# Patient Record
Sex: Female | Born: 1960 | Race: White | Hispanic: No | Marital: Married | Smoking: Never smoker
Health system: Southern US, Community
[De-identification: ages and names within clinical notes are randomized; demographics above are authoritative.]

## PROBLEM LIST (undated history)

## (undated) DIAGNOSIS — I10 Essential (primary) hypertension: Secondary | ICD-10-CM

## (undated) DIAGNOSIS — F419 Anxiety disorder, unspecified: Secondary | ICD-10-CM

## (undated) DIAGNOSIS — F329 Major depressive disorder, single episode, unspecified: Secondary | ICD-10-CM

## (undated) DIAGNOSIS — F32A Depression, unspecified: Secondary | ICD-10-CM

## (undated) HISTORY — DX: Major depressive disorder, single episode, unspecified: F32.9

## (undated) HISTORY — DX: Essential (primary) hypertension: I10

## (undated) HISTORY — PX: SPINE SURGERY: SHX786

## (undated) HISTORY — DX: Anxiety disorder, unspecified: F41.9

## (undated) HISTORY — DX: Depression, unspecified: F32.A

---

## 2001-09-24 HISTORY — PX: BREAST SURGERY: SHX581

## 2001-12-10 ENCOUNTER — Ambulatory Visit (HOSPITAL_BASED_OUTPATIENT_CLINIC_OR_DEPARTMENT_OTHER): Admission: RE | Admit: 2001-12-10 | Discharge: 2001-12-10 | Payer: Self-pay | Admitting: General Surgery

## 2003-09-25 HISTORY — PX: BREAST EXCISIONAL BIOPSY: SUR124

## 2013-08-14 ENCOUNTER — Ambulatory Visit: Payer: Self-pay | Admitting: Unknown Physician Specialty

## 2013-08-18 LAB — PATHOLOGY REPORT

## 2013-08-25 ENCOUNTER — Encounter (HOSPITAL_COMMUNITY): Payer: Self-pay | Admitting: Pharmacy Technician

## 2015-07-03 ENCOUNTER — Other Ambulatory Visit: Payer: Self-pay | Admitting: Family Medicine

## 2015-07-05 DIAGNOSIS — F329 Major depressive disorder, single episode, unspecified: Secondary | ICD-10-CM | POA: Insufficient documentation

## 2015-07-05 DIAGNOSIS — F32A Depression, unspecified: Secondary | ICD-10-CM | POA: Insufficient documentation

## 2015-07-05 DIAGNOSIS — I10 Essential (primary) hypertension: Secondary | ICD-10-CM | POA: Insufficient documentation

## 2015-07-06 ENCOUNTER — Encounter: Payer: Self-pay | Admitting: Family Medicine

## 2015-07-06 ENCOUNTER — Ambulatory Visit (INDEPENDENT_AMBULATORY_CARE_PROVIDER_SITE_OTHER): Payer: 59 | Admitting: Family Medicine

## 2015-07-06 VITALS — BP 125/62 | HR 82 | Temp 98.6°F | Ht 67.2 in | Wt 121.0 lb

## 2015-07-06 DIAGNOSIS — Z23 Encounter for immunization: Secondary | ICD-10-CM | POA: Diagnosis not present

## 2015-07-06 DIAGNOSIS — I10 Essential (primary) hypertension: Secondary | ICD-10-CM | POA: Diagnosis not present

## 2015-07-06 DIAGNOSIS — F329 Major depressive disorder, single episode, unspecified: Secondary | ICD-10-CM

## 2015-07-06 DIAGNOSIS — F32A Depression, unspecified: Secondary | ICD-10-CM

## 2015-07-06 MED ORDER — LOSARTAN POTASSIUM 50 MG PO TABS: 50.0000 mg | ORAL_TABLET | Freq: Every day | ORAL | Status: DC

## 2015-07-06 NOTE — Assessment & Plan Note (Signed)
For depression will try reducing Zoloft to 25 mg through the winter and then consider stopping medication spring.  Will get a family member or friend to monitor for recurrence.

## 2015-07-06 NOTE — Assessment & Plan Note (Signed)
For blood pressure will try reducing hydrochlorothiazide in half for several weeks monitoring blood pressure then stopping for several weeks monitoring blood pressure  If still needing blood pressure medication will prescribe losartan. If goes on losartan will need recheck after on medication for one month and check BMP.

## 2015-07-06 NOTE — Progress Notes (Signed)
BP 125/62 mmHg  Pulse 82  Temp(Src) 98.6 F (37 C)  Ht 5' 7.2" (1.707 m)  Wt 121 lb (54.885 kg)  BMI 18.84 kg/m2  SpO2 99%   Subjective:    Patient ID: Sierra Curtis, female    DOB: Feb 17, 1961, 54 y.o.   MRN: [id number]  HPI: Sierra Curtis is a 54 y.o. female  Chief Complaint  Patient presents with  . Hypertension   patient recheck hypertension concerned hydrochlorothiazide may be causing some hair loss though may be having female pattern hair loss that her mother might have had. Also has lost weight exercising and cleaned up her diet wondering if she still needs medication area Also taking Zoloft interested in maybe getting off but in no rush No symptoms of depression for years.  Relevant past medical, surgical, family and social history reviewed and updated as indicated. Interim medical history since our last visit reviewed. Allergies and medications reviewed and updated.  Review of Systems  Constitutional: Negative.   Respiratory: Negative.   Cardiovascular: Negative.     Per HPI unless specifically indicated above     Objective:    BP 125/62 mmHg  Pulse 82  Temp(Src) 98.6 F (37 C)  Ht 5' 7.2" (1.707 m)  Wt 121 lb (54.885 kg)  BMI 18.84 kg/m2  SpO2 99%  Wt Readings from Last 3 Encounters:  07/06/15 121 lb (54.885 kg)  01/03/15 132 lb (59.875 kg)    Physical Exam  Constitutional: She is oriented to person, place, and time. She appears well-developed and well-nourished. No distress.  HENT:  Head: Normocephalic and atraumatic.  Right Ear: Hearing normal.  Left Ear: Hearing normal.  Nose: Nose normal.  Eyes: Conjunctivae and lids are normal. Right eye exhibits no discharge. Left eye exhibits no discharge. No scleral icterus.  Pulmonary/Chest: Effort normal. No respiratory distress.  Musculoskeletal: Normal range of motion.  Neurological: She is alert and oriented to person, place, and time.  Skin: Skin is intact. No rash noted.  Psychiatric: She has a  normal mood and affect. Her speech is normal and behavior is normal. Judgment and thought content normal. Cognition and memory are normal.        Assessment & Plan:   Problem List Items Addressed This Visit      Cardiovascular and Mediastinum   Hypertension    For blood pressure will try reducing hydrochlorothiazide in half for several weeks monitoring blood pressure then stopping for several weeks monitoring blood pressure  If still needing blood pressure medication will prescribe losartan. If goes on losartan will need recheck after on medication for one month and check BMP.      Relevant Medications   losartan (COZAAR) 50 MG tablet     Other   Depression    For depression will try reducing Zoloft to 25 mg through the winter and then consider stopping medication spring.  Will get a family member or friend to monitor for recurrence.       Other Visit Diagnoses    Essential hypertension, benign    -  Primary    Relevant Medications    losartan (COZAAR) 50 MG tablet    Other Relevant Orders    Basic metabolic panel    Magnesium    Immunization due        Relevant Orders    Flu Vaccine QUAD 36+ mos PF IM (Fluarix & Fluzone Quad PF) (Completed)        Follow up plan: Return in  about 6 months (around 01/04/2016) for Physical Exam.

## 2015-07-07 ENCOUNTER — Encounter: Payer: Self-pay | Admitting: Family Medicine

## 2015-07-07 LAB — BASIC METABOLIC PANEL
BUN/Creatinine Ratio: 13 (ref 9–23)
BUN: 12 mg/dL (ref 6–24)
CALCIUM: 9.7 mg/dL (ref 8.7–10.2)
CO2: 27 mmol/L (ref 18–29)
CREATININE: 0.95 mg/dL (ref 0.57–1.00)
Chloride: 101 mmol/L (ref 97–108)
GFR calc Af Amer: 79 mL/min/{1.73_m2} (ref >59)
GFR, EST NON AFRICAN AMERICAN: 69 mL/min/{1.73_m2} (ref >59)
Glucose: 84 mg/dL (ref 65–99)
Potassium: 3.8 mmol/L (ref 3.5–5.2)
Sodium: 143 mmol/L (ref 134–144)

## 2015-07-07 LAB — MAGNESIUM: Magnesium: 2 mg/dL (ref 1.6–2.3)

## 2016-01-04 ENCOUNTER — Ambulatory Visit (INDEPENDENT_AMBULATORY_CARE_PROVIDER_SITE_OTHER): Payer: 59 | Admitting: Family Medicine

## 2016-01-04 ENCOUNTER — Encounter: Payer: Self-pay | Admitting: Family Medicine

## 2016-01-04 VITALS — BP 104/64 | HR 67 | Temp 98.0°F | Ht 67.7 in | Wt 123.0 lb

## 2016-01-04 DIAGNOSIS — Z0000 Encounter for general adult medical examination without abnormal findings: Secondary | ICD-10-CM

## 2016-01-04 DIAGNOSIS — F329 Major depressive disorder, single episode, unspecified: Secondary | ICD-10-CM

## 2016-01-04 DIAGNOSIS — F32A Depression, unspecified: Secondary | ICD-10-CM

## 2016-01-04 DIAGNOSIS — I10 Essential (primary) hypertension: Secondary | ICD-10-CM | POA: Diagnosis not present

## 2016-01-04 LAB — URINALYSIS, ROUTINE W REFLEX MICROSCOPIC
BILIRUBIN UA: NEGATIVE
GLUCOSE, UA: NEGATIVE
Ketones, UA: NEGATIVE
Leukocytes, UA: NEGATIVE
Nitrite, UA: NEGATIVE
PROTEIN UA: NEGATIVE
RBC UA: NEGATIVE
SPEC GRAV UA: 1.015 (ref 1.005–1.030)
UUROB: 0.2 mg/dL (ref 0.2–1.0)
pH, UA: 7 (ref 5.0–7.5)

## 2016-01-04 MED ORDER — SERTRALINE HCL 50 MG PO TABS: ORAL_TABLET | ORAL | Status: DC

## 2016-01-04 NOTE — Progress Notes (Signed)
BP 104/64 mmHg  Pulse 67  Temp(Src) 98 F (36.7 C)  Ht 5' 7.7" (1.72 m)  Wt 123 lb (55.792 kg)  BMI 18.86 kg/m2  SpO2 99%   Subjective:    Patient ID: Sierra Curtis, female    DOB: 1961/08/29, 56 y.o.   MRN: [id number]  HPI: Sierra Curtis is a 55 y.o. female  Chief Complaint  Patient presents with  . Annual Exam   Patient continues to do well has been able to maintain weight loss and good blood pressure control without taking any medication. Hair loss hasn't noticed as much and is not a problem now. Doing well with Zoloft and has decided to stay on medicine for now as doesn't want to run the risk of feeling bad again. Has had mammogram and Pap smear which are reported as normal.  Relevant past medical, surgical, family and social history reviewed and updated as indicated. Interim medical history since our last visit reviewed. Allergies and medications reviewed and updated.  Review of Systems  Constitutional: Negative.   HENT: Negative.   Eyes: Negative.   Respiratory: Negative.   Cardiovascular: Negative.   Gastrointestinal: Negative.   Endocrine: Negative.   Genitourinary: Negative.   Musculoskeletal: Negative.   Skin: Negative.   Allergic/Immunologic: Negative.   Neurological: Negative.   Hematological: Negative.   Psychiatric/Behavioral: Negative.     Per HPI unless specifically indicated above     Objective:    BP 104/64 mmHg  Pulse 67  Temp(Src) 98 F (36.7 C)  Ht 5' 7.7" (1.72 m)  Wt 123 lb (55.792 kg)  BMI 18.86 kg/m2  SpO2 99%  Wt Readings from Last 3 Encounters:  01/04/16 123 lb (55.792 kg)  07/06/15 121 lb (54.885 kg)  01/03/15 132 lb (59.875 kg)    Physical Exam  Constitutional: She is oriented to person, place, and time. She appears well-developed and well-nourished.  HENT:  Head: Normocephalic and atraumatic.  Right Ear: External ear normal.  Left Ear: External ear normal.  Nose: Nose normal.  Mouth/Throat: Oropharynx is clear and  moist.  Eyes: Conjunctivae and EOM are normal. Pupils are equal, round, and reactive to light.  Neck: Normal range of motion. Neck supple. Carotid bruit is not present.  Cardiovascular: Normal rate, regular rhythm and normal heart sounds.   No murmur heard. Pulmonary/Chest: Effort normal and breath sounds normal.  Abdominal: Soft. Bowel sounds are normal. There is no hepatosplenomegaly.  Musculoskeletal: Normal range of motion.  Neurological: She is alert and oriented to person, place, and time.  Skin: No rash noted.  Psychiatric: She has a normal mood and affect. Her behavior is normal. Judgment and thought content normal.    Results for orders placed or performed in visit on [id number]  Basic metabolic panel  Result Value Ref Range   Glucose 84 65 - 99 mg/dL   BUN 12 6 - 24 mg/dL   Creatinine, Ser 0.95 0.57 - 1.00 mg/dL   GFR calc non Af Amer 69 >59 mL/min/1.73   GFR calc Af Amer 79 >59 mL/min/1.73   BUN/Creatinine Ratio 13 9 - 23   Sodium 143 134 - 144 mmol/L   Potassium 3.8 3.5 - 5.2 mmol/L   Chloride 101 97 - 108 mmol/L   CO2 27 18 - 29 mmol/L   Calcium 9.7 8.7 - 10.2 mg/dL  Magnesium  Result Value Ref Range   Magnesium 2.0 1.6 - 2.3 mg/dL      Assessment & Plan:   Problem  List Items Addressed This Visit      Cardiovascular and Mediastinum   Hypertension    Diet controled no meds      Relevant Orders   Comprehensive metabolic panel   Lipid panel   CBC with Differential/Platelet   TSH   Urinalysis, Routine w reflex microscopic (not at [facility])     Other   Depression    The current medical regimen is effective;  continue present plan and medications.       Relevant Medications   sertraline (ZOLOFT) 50 MG tablet   Other Relevant Orders   Comprehensive metabolic panel   Lipid panel   CBC with Differential/Platelet   TSH   Urinalysis, Routine w reflex microscopic (not at [facility])    Other Visit Diagnoses    PE (physical exam), annual    -  Primary    Relevant  Orders    Comprehensive metabolic panel    Lipid panel    CBC with Differential/Platelet    TSH    Urinalysis, Routine w reflex microscopic (not at [facility])        Follow up plan: Return in about 6 months (around 07/05/2016) for recheck zoloft.

## 2016-01-04 NOTE — Assessment & Plan Note (Signed)
The current medical regimen is effective;  continue present plan and medications.  

## 2016-01-04 NOTE — Assessment & Plan Note (Signed)
Diet controled no meds

## 2016-01-05 ENCOUNTER — Encounter: Payer: Self-pay | Admitting: Family Medicine

## 2016-01-05 LAB — CBC WITH DIFFERENTIAL/PLATELET
BASOS ABS: 0 10*3/uL (ref 0.0–0.2)
BASOS: 0 %
EOS (ABSOLUTE): 0.1 10*3/uL (ref 0.0–0.4)
Eos: 2 %
HEMOGLOBIN: 12.7 g/dL (ref 11.1–15.9)
Hematocrit: 38.4 % (ref 34.0–46.6)
IMMATURE GRANS (ABS): 0 10*3/uL (ref 0.0–0.1)
Immature Granulocytes: 0 %
LYMPHS ABS: 1.6 10*3/uL (ref 0.7–3.1)
LYMPHS: 29 %
MCH: 31.3 pg (ref 26.6–33.0)
MCHC: 33.1 g/dL (ref 31.5–35.7)
MCV: 95 fL (ref 79–97)
MONOCYTES: 7 %
Monocytes Absolute: 0.4 10*3/uL (ref 0.1–0.9)
NEUTROS ABS: 3.4 10*3/uL (ref 1.4–7.0)
Neutrophils: 62 %
Platelets: 194 10*3/uL (ref 150–379)
RBC: 4.06 x10E6/uL (ref 3.77–5.28)
RDW: 13 % (ref 12.3–15.4)
WBC: 5.5 10*3/uL (ref 3.4–10.8)

## 2016-01-05 LAB — COMPREHENSIVE METABOLIC PANEL
ALK PHOS: 43 IU/L (ref 39–117)
ALT: 12 IU/L (ref 0–32)
AST: 18 IU/L (ref 0–40)
Albumin/Globulin Ratio: 2.3 — ABNORMAL HIGH (ref 1.2–2.2)
Albumin: 4.6 g/dL (ref 3.5–5.5)
BUN/Creatinine Ratio: 15 (ref 9–23)
BUN: 12 mg/dL (ref 6–24)
Bilirubin Total: 0.5 mg/dL (ref 0.0–1.2)
CO2: 29 mmol/L (ref 18–29)
CREATININE: 0.82 mg/dL (ref 0.57–1.00)
Calcium: 9.5 mg/dL (ref 8.7–10.2)
Chloride: 100 mmol/L (ref 96–106)
GFR calc Af Amer: 94 mL/min/{1.73_m2} (ref >59)
GFR, EST NON AFRICAN AMERICAN: 82 mL/min/{1.73_m2} (ref >59)
GLUCOSE: 81 mg/dL (ref 65–99)
Globulin, Total: 2 g/dL (ref 1.5–4.5)
Potassium: 4.3 mmol/L (ref 3.5–5.2)
Sodium: 143 mmol/L (ref 134–144)
Total Protein: 6.6 g/dL (ref 6.0–8.5)

## 2016-01-05 LAB — LIPID PANEL
CHOL/HDL RATIO: 3.2 ratio (ref 0.0–4.4)
CHOLESTEROL TOTAL: 173 mg/dL (ref 100–199)
HDL: 54 mg/dL (ref >39)
LDL CALC: 96 mg/dL (ref 0–99)
TRIGLYCERIDES: 115 mg/dL (ref 0–149)
VLDL Cholesterol Cal: 23 mg/dL (ref 5–40)

## 2016-01-05 LAB — TSH: TSH: 1.43 u[IU]/mL (ref 0.450–4.500)

## 2016-07-10 ENCOUNTER — Ambulatory Visit: Payer: 59 | Admitting: Family Medicine

## 2016-07-18 ENCOUNTER — Encounter: Payer: Self-pay | Admitting: Family Medicine

## 2016-07-18 ENCOUNTER — Ambulatory Visit (INDEPENDENT_AMBULATORY_CARE_PROVIDER_SITE_OTHER): Payer: 59 | Admitting: Family Medicine

## 2016-07-18 DIAGNOSIS — I10 Essential (primary) hypertension: Secondary | ICD-10-CM | POA: Diagnosis not present

## 2016-07-18 DIAGNOSIS — F325 Major depressive disorder, single episode, in full remission: Secondary | ICD-10-CM

## 2016-07-18 NOTE — Assessment & Plan Note (Signed)
The current medical regimen is effective;  continue present plan and medications.  

## 2016-07-18 NOTE — Assessment & Plan Note (Signed)
Patient's blood pressure elevated today patient will check at home and if remains elevated will notify us and we will restart losartan 50 mg. Recheck office visit with BMP 1 month after starting losartan.

## 2016-07-18 NOTE — Progress Notes (Signed)
BP (!) 144/76   Pulse 71   Temp 98.2 F (36.8 C)   Wt 128 lb (58.1 kg)   SpO2 99%   BMI 19.64 kg/m    Subjective:    Patient ID: Sierra Curtis, female    DOB: 11-11-1960, 55 y.o.   MRN: [id number]  HPI: Sierra Curtis is a 55 y.o. female  Chief Complaint  Patient presents with  . Depression   Patient doing well with depression no complaints no issues taking medications faithfully without side effects and good control of depression. Reviewed patient can still stressful phase of job and life is to continue medications.  Patient also has had hypertension in the past was able to stop hydrochlorothiazide and did well with good control of blood pressure.  Relevant past medical, surgical, family and social history reviewed and updated as indicated. Interim medical history since our last visit reviewed. Allergies and medications reviewed and updated.  Review of Systems  Constitutional: Negative.   Respiratory: Negative.   Cardiovascular: Negative.     Per HPI unless specifically indicated above     Objective:    BP (!) 144/76   Pulse 71   Temp 98.2 F (36.8 C)   Wt 128 lb (58.1 kg)   SpO2 99%   BMI 19.64 kg/m   Wt Readings from Last 3 Encounters:  07/18/16 128 lb (58.1 kg)  01/04/16 123 lb (55.8 kg)  07/06/15 121 lb (54.9 kg)    Physical Exam  Constitutional: She is oriented to person, place, and time. She appears well-developed and well-nourished. No distress.  HENT:  Head: Normocephalic and atraumatic.  Right Ear: Hearing normal.  Left Ear: Hearing normal.  Nose: Nose normal.  Eyes: Conjunctivae and lids are normal. Right eye exhibits no discharge. Left eye exhibits no discharge. No scleral icterus.  Cardiovascular: Normal rate, regular rhythm and normal heart sounds.   Pulmonary/Chest: Effort normal and breath sounds normal. No respiratory distress.  Musculoskeletal: Normal range of motion.  Neurological: She is alert and oriented to person, place, and  time.  Skin: Skin is intact. No rash noted.  Psychiatric: She has a normal mood and affect. Her speech is normal and behavior is normal. Judgment and thought content normal. Cognition and memory are normal.    Results for orders placed or performed in visit on 01/04/16  Comprehensive metabolic panel  Result Value Ref Range   Glucose 81 65 - 99 mg/dL   BUN 12 6 - 24 mg/dL   Creatinine, Ser 0.82 0.57 - 1.00 mg/dL   GFR calc non Af Amer 82 >59 mL/min/1.73   GFR calc Af Amer 94 >59 mL/min/1.73   BUN/Creatinine Ratio 15 9 - 23   Sodium 143 134 - 144 mmol/L   Potassium 4.3 3.5 - 5.2 mmol/L   Chloride 100 96 - 106 mmol/L   CO2 29 18 - 29 mmol/L   Calcium 9.5 8.7 - 10.2 mg/dL   Total Protein 6.6 6.0 - 8.5 g/dL   Albumin 4.6 3.5 - 5.5 g/dL   Globulin, Total 2.0 1.5 - 4.5 g/dL   Albumin/Globulin Ratio 2.3 (H) 1.2 - 2.2   Bilirubin Total 0.5 0.0 - 1.2 mg/dL   Alkaline Phosphatase 43 39 - 117 IU/L   AST 18 0 - 40 IU/L   ALT 12 0 - 32 IU/L  Lipid panel  Result Value Ref Range   Cholesterol, Total 173 100 - 199 mg/dL   Triglycerides 115 0 - 149 mg/dL  HDL 54 >39 mg/dL   VLDL Cholesterol Cal 23 5 - 40 mg/dL   LDL Calculated 96 0 - 99 mg/dL   Chol/HDL Ratio 3.2 0.0 - 4.4 ratio units  CBC with Differential/Platelet  Result Value Ref Range   WBC 5.5 3.4 - 10.8 x10E3/uL   RBC 4.06 3.77 - 5.28 x10E6/uL   Hemoglobin 12.7 11.1 - 15.9 g/dL   Hematocrit 38.4 34.0 - 46.6 %   MCV 95 79 - 97 fL   MCH 31.3 26.6 - 33.0 pg   MCHC 33.1 31.5 - 35.7 g/dL   RDW 13.0 12.3 - 15.4 %   Platelets 194 150 - 379 x10E3/uL   Neutrophils 62 %   Lymphs 29 %   Monocytes 7 %   Eos 2 %   Basos 0 %   Neutrophils Absolute 3.4 1.4 - 7.0 x10E3/uL   Lymphocytes Absolute 1.6 0.7 - 3.1 x10E3/uL   Monocytes Absolute 0.4 0.1 - 0.9 x10E3/uL   EOS (ABSOLUTE) 0.1 0.0 - 0.4 x10E3/uL   Basophils Absolute 0.0 0.0 - 0.2 x10E3/uL   Immature Granulocytes 0 %   Immature Grans (Abs) 0.0 0.0 - 0.1 x10E3/uL  TSH  Result Value  Ref Range   TSH 1.430 0.450 - 4.500 uIU/mL  Urinalysis, Routine w reflex microscopic (not at [facility])  Result Value Ref Range   Specific Gravity, UA 1.015 1.005 - 1.030   pH, UA 7.0 5.0 - 7.5   Color, UA Yellow Yellow   Appearance Ur Cloudy (A) Clear   Leukocytes, UA Negative Negative   Protein, UA Negative Negative/Trace   Glucose, UA Negative Negative   Ketones, UA Negative Negative   RBC, UA Negative Negative   Bilirubin, UA Negative Negative   Urobilinogen, Ur 0.2 0.2 - 1.0 mg/dL   Nitrite, UA Negative Negative      Assessment & Plan:   Problem List Items Addressed This Visit      Cardiovascular and Mediastinum   Hypertension    Patient's blood pressure elevated today patient will check at home and if remains elevated will notify us and we will restart losartan 50 mg. Recheck office visit with BMP 1 month after starting losartan.         Other   Depression    The current medical regimen is effective;  continue present plan and medications.        Other Visit Diagnoses   None.      Follow up plan: Return in about 6 months (around 01/16/2017), or if symptoms worsen or fail to improve, for Physical Exam.

## 2016-11-01 ENCOUNTER — Other Ambulatory Visit: Payer: Self-pay | Admitting: Obstetrics & Gynecology

## 2016-11-01 DIAGNOSIS — Z01419 Encounter for gynecological examination (general) (routine) without abnormal findings: Secondary | ICD-10-CM | POA: Diagnosis not present

## 2016-11-01 DIAGNOSIS — Z1231 Encounter for screening mammogram for malignant neoplasm of breast: Secondary | ICD-10-CM

## 2016-11-01 DIAGNOSIS — Z1211 Encounter for screening for malignant neoplasm of colon: Secondary | ICD-10-CM | POA: Diagnosis not present

## 2016-11-14 DIAGNOSIS — M542 Cervicalgia: Secondary | ICD-10-CM | POA: Diagnosis not present

## 2016-11-22 DIAGNOSIS — I10 Essential (primary) hypertension: Secondary | ICD-10-CM | POA: Diagnosis not present

## 2016-11-22 DIAGNOSIS — M5412 Radiculopathy, cervical region: Secondary | ICD-10-CM | POA: Diagnosis not present

## 2016-11-27 ENCOUNTER — Ambulatory Visit: Payer: 59 | Attending: Neurological Surgery | Admitting: Physical Therapy

## 2016-11-27 DIAGNOSIS — M5412 Radiculopathy, cervical region: Secondary | ICD-10-CM | POA: Insufficient documentation

## 2016-11-27 DIAGNOSIS — R293 Abnormal posture: Secondary | ICD-10-CM | POA: Diagnosis present

## 2016-11-27 NOTE — Therapy (Signed)
[facility] [address]  [place], [place], [address] Phone: [phone]   Fax:  [phone]  Physical Therapy Evaluation  Patient Details  Name: Sierra Curtis MRN: [id number] Date of Birth: Dec 03, 1960 Referring Provider: Earleen Newport, MD  Encounter Date: 11/27/2016      PT End of Session - 11/27/16 1620    Visit Number 1   Number of Visits 12   Date for PT Re-Evaluation 01/11/17   Authorization Type [facility] - VL: 51   Authorization - Number of Visits 37   PT Start Time 1620   PT Stop Time 1711   PT Time Calculation (min) 51 min   Activity Tolerance Patient tolerated treatment well   Behavior During Therapy [facility] for tasks assessed/performed      Past Medical History:  Diagnosis Date  . Depression   . Hypertension     Past Surgical History:  Procedure Laterality Date  . BREAST SURGERY  2003  . CESAREAN SECTION    . SPINE SURGERY      There were no vitals filed for this visit.       Subjective Assessment - 11/27/16 1627    Subjective Pt reports h/o neck and shoulder pain for 6-7 wks, worsening over the past 3 weeks. Intermittent initially, but then became more constant. Tried a massage which made things worse and now has numbness and tingling in L chest, lateral neck and shoulder.   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 Yes   Pain Score 2   least 0/10, avg 4/10, worst 7/10   Pain Location Neck   Pain Orientation Left   Pain Descriptors / Indicators Stabbing;Numbness;Tingling;Throbbing   Pain Type Acute pain   Pain Onset More than a month ago   Pain Frequency Intermittent   Aggravating Factors  fatigue late in the day (prolonged sitting), driving   Pain Relieving Factors lying down, heat   Effect of Pain  on Daily Activities has not been able to knit as much as she normally does; mild limitation wtih turning head while driving; working on adjusting workstation eronomics            [facility] PT Assessment - 11/27/16 1620      Assessment   Medical Diagnosis Cervical radiculopathy   Referring Provider Earleen Newport, MD   Onset Date/Surgical Date 10/11/16   Hand Dominance Right   Next MD Visit 01/02/17   Prior Therapy PT for ruptured lumbar disc in 2015     Balance Screen   Has the patient fallen in the past 6 months No   Has the patient had a decrease in activity level because of a fear of falling?  No   Is the patient reluctant to leave their home because of a fear of falling?  No     Home Ec[employer] residence   Home Access Stairs to enter   Entrance Stairs-Number of Steps [place] Two level;Bed/bath upstairs     Prior Function   Level of Independence Independent   Vocation Full time employment   Vocation Requirements deskwork   Leisure knitting     Observation/Other Assessments   Focus on Therapeutic Outcomes (FOTO)  Neck -  63% (37% limitation); predicted 72% (28% limitation)     Posture/Postural Control   Posture/Postural Control Postural limitations   Postural Limitations Forward head;Rounded Shoulders     ROM / Strength   AROM / PROM / Strength AROM;Strength     AROM   Overall AROM Comments B shoulder ROM WFL with mildly limited IR on L   AROM Assessment Site Cervical;Shoulder   Cervical Flexion 35  tightness   Cervical Extension 38   Cervical - Right Side Bend 22   Cervical - Left Side Bend 18  stiff   Cervical - Right Rotation 54   Cervical - Left Rotation 43  stiff     Strength   Strength Assessment Site Shoulder   Right/Left Shoulder Right;Left   Right Shoulder Flexion 4+/5   Right Shoulder ABduction 4+/5   Right Shoulder Internal Rotation 4+/5   Right Shoulder External Rotation 4/5   Left Shoulder Flexion 4+/5    Left Shoulder ABduction 4/5   Left Shoulder Internal Rotation 4/5   Left Shoulder External Rotation 4/5                   [facility] Adult PT Treatment/Exercise - 11/27/16 1620      Self-Care   Self-Care Posture   Posture Educated pt on neutral spine and shoulder posture including ideal workstation set-up for [place] and keyboard heights.     Exercises   Exercises Shoulder;Neck     Neck Exercises: Seated   Neck Retraction 10 reps;5 secs     Shoulder Exercises: Standing   Retraction Both;10 reps   Retraction Limitations 5" hold with back against pool noodle on wall     Neck Exercises: Stretches   Upper Trapezius Stretch 30 seconds;2 reps   Upper Trapezius Stretch Limitations B seated with hand anchored on edge of seat   Levator Stretch 30 seconds;2 reps   Levator Stretch Limitations B seated with hand anchored on edge of seat   Corner Stretch 30 seconds;1 rep   Corner Stretch Limitations low/mid/high doorway stretch                PT Education - 11/27/16 1711    Education provided Yes   Education Details PT eval findings, POC, postural education & initial HEP   Person(s) Educated Patient   Methods Explanation;Demonstration;Handout   Comprehension Verbalized understanding;Returned demonstration;Need further instruction          PT Short Term Goals - 11/27/16 1758      PT SHORT TERM GOAL #1   Title Independent with initial HEP by 12/14/16   Status New     PT SHORT TERM GOAL #2   Title Pt will verbalize understanding of neutral spine and shoulder posture by 12/14/16   Status New           PT Long Term Goals - 11/27/16 1759      PT LONG TERM GOAL #1   Title Independent with advanced HEP by 01/11/17   Status New     PT LONG TERM GOAL #2   Title Pt will routinely demonstrate neutral spine and shoulder posture w/o cues >/= 75% of time by 01/11/17   Status New     PT LONG TERM GOAL #3   Title Cervical ROM WFL w/o pain or tightness by 01/11/17 to  allow for adequate motion to check blindspot while driving by [id number]   Status New     PT LONG TERM GOAL #4   Title Pt will report  no further numbness or tingling in chest, neck or L UE by 01/11/17   Status New     PT LONG TERM GOAL #5   Title Pt will report ability to resume knitting w/o limitation due to neck pain or radicular symptoms by 01/11/17   Status New               Plan - 11/27/16 1711    Clinical Impression Statement Sierra Curtis is a 56 y/o female who presents to OP PT with 6-7 wk h/o cervical and L shoulder pain, numbness and tingling without known triggering event, worsening over the past 3 weeks. Per pt, x-rays performed at MD office revealed DDD at C5-6 with bone spur present. Pain typically worsens as the day progresses and limits the patient's tolerance for driving and knitting. Assessment revealed forward head and shoulder posture with increased muscle tension in pecs, upper trap and levator. Cervical ROM decreased in all planes with tightness/stiffness reported in flexion, L rotation and L sidebending but no increase in pain or radicular symptoms. Manual traction attempted also w/o change in symptoms. B shoulder ROM WNL with very mild L shoulder weakness in abduction, IR & ER relative to R. POC will focus on postural and body mechanics training to improve functional alignment, improving cervical flexibility along with soft tissue pliability, stretching to restore normal cervical ROM, scapular stabilization and shoulder strengthening, with manual therapy and modalities PRN for pain. May also consider mechanical traction, taping and/or DN as indicated.   Rehab Potential Good   PT Frequency 2x / week   PT Duration 6 weeks   PT Treatment/Interventions Patient/family education;Neuromuscular re-education;Therapeutic exercise;Manual techniques;Dry needling;Taping;Electrical Stimulation;Moist Heat;Cryotherapy;Iontophoresis 4mg /ml Dexamethasone;Ultrasound;Traction;ADLs/Self Care Home  Management   Consulted and Agree with Plan of Care Patient      Patient will benefit from skilled therapeutic intervention in order to improve the following deficits and impairments:  Pain, Impaired flexibility, Decreased range of motion, Decreased strength, Impaired tone, Impaired UE functional use  Visit Diagnosis: Radiculopathy, cervical region  Abnormal posture     Problem List Patient Active Problem List   Diagnosis Date Noted  . Depression   . Hypertension     Percival Spanish, PT, MPT 11/27/2016, 6:09 PM  [facility] [address]  [place], [place], [address] Phone: [phone]   Fax:  [phone]  Name: Sierra Curtis MRN: [id number] Date of Birth: Dec 04, 1960

## 2016-11-29 ENCOUNTER — Ambulatory Visit
Admission: RE | Admit: 2016-11-29 | Discharge: 2016-11-29 | Disposition: A | Discharge status: Home / Self Care | Payer: 59 | Source: Ambulatory Visit | Attending: Obstetrics & Gynecology | Admitting: Obstetrics & Gynecology

## 2016-11-29 DIAGNOSIS — Z1231 Encounter for screening mammogram for malignant neoplasm of breast: Secondary | ICD-10-CM | POA: Diagnosis not present

## 2016-11-30 ENCOUNTER — Ambulatory Visit: Payer: 59

## 2016-11-30 DIAGNOSIS — R293 Abnormal posture: Secondary | ICD-10-CM

## 2016-11-30 DIAGNOSIS — M5412 Radiculopathy, cervical region: Secondary | ICD-10-CM

## 2016-11-30 NOTE — Therapy (Signed)
[facility] [address]  [place], [place], [address] Phone: [phone]   Fax:  [phone]  Physical Therapy Treatment  Patient Details  Name: Sierra Curtis MRN: [id number] Date of Birth: Apr 23, 1961 Referring Provider: Earleen Newport, MD  Encounter Date: 11/30/2016      PT End of Session - 11/30/16 0809    Visit Number 2   Number of Visits 12   Date for PT Re-Evaluation 01/11/17   Authorization Type [facility] - VL: 43   Authorization - Number of Visits 65   PT Start Time 0803   PT Stop Time 0845   PT Time Calculation (min) 42 min   Activity Tolerance Patient tolerated treatment well   Behavior During Therapy [facility] for tasks assessed/performed      Past Medical History:  Diagnosis Date  . Depression   . Hypertension     Past Surgical History:  Procedure Laterality Date  . BREAST BIOPSY Right 2005  . BREAST SURGERY  2003  . CESAREAN SECTION    . SPINE SURGERY      There were no vitals filed for this visit.      Subjective Assessment - 11/30/16 0804    Subjective L shoulder tingling, numbness, and pain still present today.     Patient Stated Goals "for the pain and numbness to go away"   Currently in Pain? Yes   Pain Score 2    Pain Location Neck   Pain Orientation Left   Pain Descriptors / Indicators --  "Pulsing", "pulling" "strain"   Pain Type Acute pain   Pain Onset More than a month ago   Multiple Pain Sites No                         [facility] Adult PT Treatment/Exercise - 11/30/16 0815      Neck Exercises: Standing   Neck Retraction 10 reps   Neck Retraction Limitations 5" hold; on pool noodle on doorseal      Neck Exercises: Seated   Neck Retraction 10 reps;5 secs     Shoulder Exercises: Supine   External Rotation AROM;15 reps;Both;Theraband  5" hold    Theraband Level (Shoulder External Rotation) Level 1 (Yellow)   Other Supine Exercises supine laying on 1/2 foam  bolster 4 x 30 sec; multi-angle chest stretch     Shoulder Exercises: Standing   External Rotation AROM;10 reps;Theraband  5" hold with scap. squeeze; on pool noodle    Theraband Level (Shoulder External Rotation) Level 1 (Yellow)   Retraction Both;10 reps   Retraction Limitations 5" hold with back against pool noodle on wall     Neck Exercises: Stretches   Upper Trapezius Stretch 30 seconds;2 reps   Upper Trapezius Stretch Limitations B seated with hand anchored on edge of seat   Levator Stretch 30 seconds;2 reps   Levator Stretch Limitations B seated with hand anchored on edge of seat   Corner Stretch 30 seconds;1 rep   Corner Stretch Limitations low/mid/high doorway stretch                  PT Short Term Goals - 11/30/16 0809      PT SHORT TERM GOAL #1   Title Independent with initial HEP by 12/14/16   Status On-going     PT SHORT TERM GOAL #2   Title Pt will verbalize understanding of neutral spine and shoulder posture by 12/14/16  Status On-going           PT Long Term Goals - 11/30/16 3009      PT LONG TERM GOAL #1   Title Independent with advanced HEP by 01/11/17   Status On-going     PT LONG TERM GOAL #2   Title Pt will routinely demonstrate neutral spine and shoulder posture w/o cues >/= 75% of time by 01/11/17   Status On-going     PT LONG TERM GOAL #3   Title Cervical ROM WFL w/o pain or tightness by 01/11/17 to allow for adequate motion to check blindspot while driving by 2/33/00   Status On-going     PT LONG TERM GOAL #4   Title Pt will report no further numbness or tingling in chest, neck or L UE by 01/11/17   Status On-going     PT LONG TERM GOAL #5   Title Pt will report ability to resume knitting w/o limitation due to neck pain or radicular symptoms by 01/11/17   Status On-going               Plan - 11/30/16 0809    Clinical Impression Statement L shoulder pain, numbness, and tingling still present today.  Today's treatment  focusing on HEP review with pt. demonstrating good overall technique requiring min cueing for correction.  Progressed with gentle scapular strengthening today with pt. tolerating very well with decrease in pain level following therex.  Numbness and tingling intermittent throughout stretching and strengthening activity.  Supine on 1/2 bolster initiated without issue.  Pt. with report today of modifying desk ergonomics to improve work posture.  Pt. will continue to benefit from further skilled therapy to decrease radicular symptoms, improve cervical ROM, and maximize function.    PT Treatment/Interventions Patient/family education;Neuromuscular re-education;Therapeutic exercise;Manual techniques;Dry needling;Taping;Electrical Stimulation;Moist Heat;Cryotherapy;Iontophoresis 4mg /ml Dexamethasone;Ultrasound;Traction;ADLs/Self Care Home Management      Patient will benefit from skilled therapeutic intervention in order to improve the following deficits and impairments:  Pain, Impaired flexibility, Decreased range of motion, Decreased strength, Impaired tone, Impaired UE functional use  Visit Diagnosis: Radiculopathy, cervical region  Abnormal posture     Problem List Patient Active Problem List   Diagnosis Date Noted  . Depression   . Hypertension     [name], [place] 11/30/16 1:29 PM   [facility] [address]  [place], [place], [address] Phone: [phone]   Fax:  [phone]  Name: Sierra Curtis MRN: [id number] Date of Birth: 1961-09-03

## 2016-12-03 ENCOUNTER — Ambulatory Visit: Payer: 59 | Admitting: Physical Therapy

## 2016-12-03 DIAGNOSIS — R293 Abnormal posture: Secondary | ICD-10-CM

## 2016-12-03 DIAGNOSIS — M5412 Radiculopathy, cervical region: Secondary | ICD-10-CM | POA: Diagnosis not present

## 2016-12-03 NOTE — Patient Instructions (Signed)

## 2016-12-03 NOTE — Therapy (Signed)
[facility] [address]  [place], [place], [address] Phone: [phone]   Fax:  [phone]  Physical Therapy Treatment  Patient Details  Name: Sierra Curtis MRN: [id number] Date of Birth: [birth year] Referring Provider: Earleen Newport, MD  Encounter Date: 12/03/2016      PT End of Session - 12/03/16 0804    Visit Number 3   Number of Visits 12   Date for PT Re-Evaluation 01/11/17   Authorization Type [facility] - VL: 61   Authorization - Number of Visits 60   PT Start Time 0804   PT Stop Time 0901   PT Time Calculation (min) 57 min   Activity Tolerance Patient tolerated treatment well   Behavior During Therapy [facility] for tasks assessed/performed      Past Medical History:  Diagnosis Date  . Depression   . Hypertension     Past Surgical History:  Procedure Laterality Date  . BREAST BIOPSY Right 2005  . BREAST SURGERY  2003  . CESAREAN SECTION    . SPINE SURGERY      There were no vitals filed for this visit.      Subjective Assessment - 12/03/16 0807    Subjective Pt reporting numbness and tingling seems to have lessened, with mostly numb just on the chest and not so much on the back of the neck. Stretching seems to be helping.   Pertinent History Lumbar disk surgery 04/2014   Patient Stated Goals "for the pain and numbness to go away"   Currently in Pain? Yes   Pain Score 2    Pain Location Neck   Pain Orientation Left   Pain Descriptors / Indicators Sore;Aching   Pain Type Acute pain   Pain Radiating Towards numbness in chest   Pain Onset More than a month ago   Pain Frequency Intermittent   Aggravating Factors  sitting for prolonged period, esp when working or knitting   Pain Relieving Factors stretches                         [facility] Adult PT Treatment/Exercise - 12/03/16 0804      Neck Exercises: Machines for Strengthening   UBE (Upper Arm Bike) lvl 2.0 fwd/back x 3' each      Shoulder Exercises: Supine   Horizontal ABduction Both;10 reps;Theraband   Theraband Level (Shoulder Horizontal ABduction) Level 1 (Yellow)   Horizontal ABduction Limitations hooklying on 1/2 FR, emphasis on scap retraction   External Rotation Both;15 reps;Theraband   Theraband Level (Shoulder External Rotation) Level 1 (Yellow)   External Rotation Limitations hooklying on 1/2 FR, emphasis on scap retraction   Flexion Both;15 reps;Theraband   Theraband Level (Shoulder Flexion) Level 1 (Yellow)   Flexion Limitations alt with opp shoulder extension hooklying on 1/2 FR, emphasis on scap retraction     Modalities   Modalities Electrical Stimulation;Moist Heat     Moist Heat Therapy   Number Minutes Moist Heat 15 Minutes   Moist Heat Location Cervical;Shoulder     Electrical Stimulation   Electrical Stimulation Location L cervical and shoulder   Electrical Stimulation Action IFC   Electrical Stimulation Parameters 80-150 Hz, intensity to pt tol x15'   Electrical Stimulation Goals Pain;Tone     Manual Therapy   Manual Therapy Soft tissue mobilization;Myofascial release   Soft tissue mobilization B UT, LS & pecs - L>R   Myofascial Release TPR to L UT &  LS     Neck Exercises: Stretches   Other Neck Stretches hooklying chest/pec stretch over 1/2 FR - multi-angle arm position x30" each                  PT Short Term Goals - 12/03/16 0840      PT SHORT TERM GOAL #1   Title Independent with initial HEP by 12/14/16   Status Achieved     PT SHORT TERM GOAL #2   Title Pt will verbalize understanding of neutral spine and shoulder posture by 12/14/16   Status On-going           PT Long Term Goals - 11/30/16 0824      PT LONG TERM GOAL #1   Title Independent with advanced HEP by 01/11/17   Status On-going     PT LONG TERM GOAL #2   Title Pt will routinely demonstrate neutral spine and shoulder posture w/o cues >/= 75% of time by 01/11/17   Status On-going     PT LONG  TERM GOAL #3   Title Cervical ROM WFL w/o pain or tightness by 01/11/17 to allow for adequate motion to check blindspot while driving by 01/01/80   Status On-going     PT LONG TERM GOAL #4   Title Pt will report no further numbness or tingling in chest, neck or L UE by 01/11/17   Status On-going     PT LONG TERM GOAL #5   Title Pt will report ability to resume knitting w/o limitation due to neck pain or radicular symptoms by 01/11/17   Status On-going               Plan - 12/03/16 0811    Clinical Impression Statement Pt noting improvement in pain and numbness with numbness only present in chest and not in back today. Denies any issues/concerns with HEP, but reports continued decreased awareness of posture at times and continues to stay in same position for extended time until pain causes her to stop and stretch/reposition. Pt does note benefit from stretches when this occurs. Progressed scapular strengthening and will plan to update HEP as appropriate next visit.   Rehab Potential Good   PT Treatment/Interventions Patient/family education;Neuromuscular re-education;Therapeutic exercise;Manual techniques;Dry needling;Taping;Electrical Stimulation;Moist Heat;Cryotherapy;Iontophoresis 4mg /ml Dexamethasone;Ultrasound;Traction;ADLs/Self Care Home Management   PT Next Visit Plan Postural training - Anterior stretching & scapular stabilization; Manual therapy to address increased muscle tension/tightness with possible trial of taping; Modalities PRN   Consulted and Agree with Plan of Care Patient      Patient will benefit from skilled therapeutic intervention in order to improve the following deficits and impairments:  Pain, Impaired flexibility, Decreased range of motion, Decreased strength, Impaired tone, Impaired UE functional use  Visit Diagnosis: Radiculopathy, cervical region  Abnormal posture     Problem List Patient Active Problem List   Diagnosis Date Noted  . Depression    . Hypertension     Percival Spanish, PT, MPT 12/03/2016, 11:30 AM  [facility] [address] [facility] [address]  [place], [place], [address] Phone: [phone]   Fax:  [phone]  Name: Sierra Curtis MRN: [id number] Date of Birth: 05-11-1961

## 2016-12-05 ENCOUNTER — Ambulatory Visit: Payer: 59

## 2016-12-05 ENCOUNTER — Other Ambulatory Visit: Payer: Self-pay | Admitting: *Deleted

## 2016-12-05 ENCOUNTER — Inpatient Hospital Stay
Admission: RE | Admit: 2016-12-05 | Discharge: 2016-12-05 | Disposition: A | Discharge status: Home / Self Care | Payer: Self-pay | Source: Ambulatory Visit | Attending: *Deleted | Admitting: *Deleted

## 2016-12-05 DIAGNOSIS — Z9289 Personal history of other medical treatment: Secondary | ICD-10-CM

## 2016-12-05 DIAGNOSIS — M5412 Radiculopathy, cervical region: Secondary | ICD-10-CM

## 2016-12-05 DIAGNOSIS — R293 Abnormal posture: Secondary | ICD-10-CM

## 2016-12-05 NOTE — Therapy (Signed)
[facility] [address]  [place], [place], [address] Phone: [phone]   Fax:  [phone]  Physical Therapy Treatment  Patient Details  Name: Sierra Curtis MRN: [id number] Date of Birth: 1961-01-16 Referring Provider: Earleen Newport, MD  Encounter Date: 12/05/2016      PT End of Session - 12/05/16 0804    Visit Number 4   Number of Visits 12   Date for PT Re-Evaluation 01/11/17   Authorization Type [facility] - VL: 60   Authorization - Number of Visits 60   PT Start Time 0800   PT Stop Time 0900   PT Time Calculation (min) 60 min   Activity Tolerance Patient tolerated treatment well   Behavior During Therapy [facility] for tasks assessed/performed      Past Medical History:  Diagnosis Date  . Depression   . Hypertension     Past Surgical History:  Procedure Laterality Date  . BREAST BIOPSY Right 2005  . BREAST SURGERY  2003  . CESAREAN SECTION    . SPINE SURGERY      There were no vitals filed for this visit.      Subjective Assessment - 12/05/16 0802    Subjective Pt. reporting dull aching in L shoulder today however reporting less numbness and no tingling today.  Numbness in L sided chest.     Patient Stated Goals "for the pain and numbness to go away"   Currently in Pain? Yes   Pain Score 2    Pain Location Shoulder   Pain Orientation Left   Pain Descriptors / Indicators Sore;Aching   Pain Type Acute pain   Pain Onset More than a month ago   Pain Frequency Intermittent   Multiple Pain Sites No                         [facility] Adult PT Treatment/Exercise - 12/05/16 0808      Neck Exercises: Machines for Strengthening   UBE (Upper Arm Bike) lvl 2.5 fwd/back x 3' each     Shoulder Exercises: Supine   Horizontal ABduction Both;Theraband;15 reps   Theraband Level (Shoulder Horizontal ABduction) Level 2 (Red)   External Rotation Both;15 reps;Theraband  5" hold    Theraband Level  (Shoulder External Rotation) Level 1 (Yellow)   External Rotation Limitations hooklying on 1/2 FR, emphasis on scap retraction   Other Supine Exercises supine laying on 1/2 foam bolster 3 x 30 sec; multi-angle chest stretch     Shoulder Exercises: Standing   Extension AROM;Strengthening;10 reps  3" hold    Theraband Level (Shoulder Extension) Level 1 (Yellow)   Row AROM;Strengthening;10 reps;Theraband  5" hold    Theraband Level (Shoulder Row) Level 2 (Red)     Shoulder Exercises: Stretch   Corner Stretch 30 seconds;3 reps     Modalities   Modalities Electrical Stimulation;Moist Heat     Moist Heat Therapy   Number Minutes Moist Heat 15 Minutes   Moist Heat Location Cervical;Shoulder     Electrical Stimulation   Electrical Stimulation Location L cervical and shoulder   Electrical Stimulation Action IFC   Electrical Stimulation Parameters 80-150 Hz, intensity to pt. tolerance, 15 min    Electrical Stimulation Goals Pain;Tone     Manual Therapy   Manual Therapy Soft tissue mobilization;Myofascial release   Soft tissue mobilization gentle STM and overpressure into chest stretch supine on 1/2 foam bolster  PT Education - 12/05/16 1259    Education provided Yes   Education Details chest stretch supine on bolster, alternating flexion/extension arm raise supine on bolster, retraction/extension with yellow TB issued to pt., row with red TB issued to pt.   Person(s) Educated Patient   Methods Explanation;Demonstration;Handout;Tactile cues;Verbal cues   Comprehension Verbalized understanding;Returned demonstration;Verbal cues required;Tactile cues required;Need further instruction          PT Short Term Goals - 12/03/16 0840      PT SHORT TERM GOAL #1   Title Independent with initial HEP by 12/14/16   Status Achieved     PT SHORT TERM GOAL #2   Title Pt will verbalize understanding of neutral spine and shoulder posture by 12/14/16   Status On-going            PT Long Term Goals - 11/30/16 0824      PT LONG TERM GOAL #1   Title Independent with advanced HEP by 01/11/17   Status On-going     PT LONG TERM GOAL #2   Title Pt will routinely demonstrate neutral spine and shoulder posture w/o cues >/= 75% of time by 01/11/17   Status On-going     PT LONG TERM GOAL #3   Title Cervical ROM WFL w/o pain or tightness by 01/11/17 to allow for adequate motion to check blindspot while driving by 3/33/54   Status On-going     PT LONG TERM GOAL #4   Title Pt will report no further numbness or tingling in chest, neck or L UE by 01/11/17   Status On-going     PT LONG TERM GOAL #5   Title 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 reporting decreased numbness today and consistent performance of HEP.  Scapular retraction activities progressed today and added to HEP.  Pt. able to perform all conservative scap. retraction activities added to HEP today with good overall technique today.  Some cueing tactile/verbal cueing required for full retraction today.  Pt. reporting benefit from moist heat/E-stim. combo thus this continued today.  Will plan to monitor response to updated HEP in coming visits.  Pt. will continue to benefit from further skilled therapy to maximize function.   PT Treatment/Interventions Patient/family education;Neuromuscular re-education;Therapeutic exercise;Manual techniques;Dry needling;Taping;Electrical Stimulation;Moist Heat;Cryotherapy;Iontophoresis 4mg /ml Dexamethasone;Ultrasound;Traction;ADLs/Self Care Home Management   PT Next Visit Plan Postural training - Anterior stretching & scapular stabilization; Manual therapy to address increased muscle tension/tightness with possible trial of taping; Modalities PRN      Patient will benefit from skilled therapeutic  intervention in order to improve the following deficits and impairments:  Pain, Impaired flexibility, Decreased range of motion, Decreased strength, Impaired tone, Impaired UE functional use  Visit Diagnosis: Radiculopathy, cervical region  Abnormal posture     Problem List Patient Active Problem List   Diagnosis Date Noted  . Depression   . Hypertension     Sierra Curtis, [place] 12/05/16 1:13 PM  [facility] [address]  [place], [place], [address] Phone: [phone]   Fax:  [phone]  Name: Sierra Curtis MRN: [id number] Date of Birth: [birth year]

## 2016-12-10 ENCOUNTER — Ambulatory Visit: Payer: 59 | Admitting: Physical Therapy

## 2016-12-10 DIAGNOSIS — R293 Abnormal posture: Secondary | ICD-10-CM

## 2016-12-10 DIAGNOSIS — M5412 Radiculopathy, cervical region: Secondary | ICD-10-CM | POA: Diagnosis not present

## 2016-12-10 NOTE — Therapy (Signed)
[facility] [address]  [place], [place], [address] Phone: [phone]   Fax:  [phone]  Physical Therapy Treatment  Patient Details  Name: BRADY PLANT MRN: [id number] Date of Birth: 06/24/1961 Referring Provider: Earleen Newport, MD  Encounter Date: 12/10/2016      PT End of Session - 12/10/16 0802    Visit Number 5   Number of Visits 12   Date for PT Re-Evaluation 01/11/17   Authorization Type [facility] - VL: 27   Authorization - Number of Visits 60   PT Start Time 0802   PT Stop Time 0842   PT Time Calculation (min) 40 min   Activity Tolerance Patient tolerated treatment well   Behavior During Therapy [facility] for tasks assessed/performed      Past Medical History:  Diagnosis Date  . Depression   . Hypertension     Past Surgical History:  Procedure Laterality Date  . BREAST BIOPSY Right 2005  . BREAST SURGERY  2003  . CESAREAN SECTION    . SPINE SURGERY      There were no vitals filed for this visit.      Subjective Assessment - 12/10/16 0805    Subjective Pt stating "feeling pretty good" today; denies pain.   Patient Stated Goals "for the pain and numbness to go away"   Currently in Pain? No/denies   Pain Onset More than a month ago                         [facility] Adult PT Treatment/Exercise - 12/10/16 0802      Neck Exercises: Machines for Strengthening   UBE (Upper Arm Bike) lvl 2.5 fwd/back x 3' each     Neck Exercises: Standing   Wall Push Ups 10 reps     Shoulder Exercises: Supine   Horizontal ABduction Both;15 reps;Theraband   Theraband Level (Shoulder Horizontal ABduction) Level 2 (Red)   Horizontal ABduction Limitations hooklying on 1/2 FR, emphasis on scap retraction   External Rotation Both;15 reps;Theraband   Theraband Level (Shoulder External Rotation) Level 2 (Red)   External Rotation Limitations hooklying on 1/2 FR, emphasis on scap retraction   Flexion  Both;15 reps;Theraband   Theraband Level (Shoulder Flexion) Level 2 (Red)   Flexion Limitations alt with opp shoulder extension hooklying on 1/2 FR, emphasis on scap retraction     Manual Therapy   Manual Therapy Soft tissue mobilization;Myofascial release;Passive ROM   Soft tissue mobilization Subocciptals; B UT, LS, scalenes & pecs - L>R   Myofascial Release TPR to L UT & LS   Passive ROM Gentle cervical PROM all directions     Neck Exercises: Stretches   Other Neck Stretches hooklying chest/pec stretch over 1/2 FR - multi-angle arm position x30" each                  PT Short Term Goals - 12/10/16 0830      PT SHORT TERM GOAL #1   Title Independent with initial HEP by 12/14/16   Status Achieved     PT SHORT TERM GOAL #2   Title Pt will verbalize understanding of neutral spine and shoulder posture by 12/14/16   Status Achieved           PT Long Term Goals - 11/30/16 0824      PT LONG TERM GOAL #1   Title Independent with advanced HEP by 01/11/17  Status On-going     PT LONG TERM GOAL #2   Title Pt will routinely demonstrate neutral spine and shoulder posture w/o cues >/= 75% of time by 01/11/17   Status On-going     PT LONG TERM GOAL #3   Title Cervical ROM WFL w/o pain or tightness by 01/11/17 to allow for adequate motion to check blindspot while driving by 6/71/24   Status On-going     PT LONG TERM GOAL #4   Title Pt will report no further numbness or tingling in chest, neck or L UE by 01/11/17   Status On-going     PT LONG TERM GOAL #5   Title Pt will report ability to resume knitting w/o limitation due to neck pain or radicular symptoms by 01/11/17   Status On-going               Plan - 12/10/16 0806    Clinical Impression Statement Pt demonstratin good initial progress with PT, reporting no pain today and numbness now more an awareness of decreased sensation on the L chest rather than true numbness like she was initially experiencing. Pt  demonstrating good tolerance for exercise progression and will plan to continue to transition to more upright activities/exercises. Moist heat/estim deferred at end of session as pt remained painfree and reporting feeling even better than than when she arrived.   Rehab Potential Good   PT Treatment/Interventions Patient/family education;Neuromuscular re-education;Therapeutic exercise;Manual techniques;Dry needling;Taping;Electrical Stimulation;Moist Heat;Cryotherapy;Iontophoresis 4mg /ml Dexamethasone;Ultrasound;Traction;ADLs/Self Care Home Management   PT Next Visit Plan Postural training - anterior stretching & scapular stabilization; Manual therapy to address increased muscle tension/tightness with possible trial of taping; Modalities PRN   Consulted and Agree with Plan of Care Patient      Patient will benefit from skilled therapeutic intervention in order to improve the following deficits and impairments:  Pain, Impaired flexibility, Decreased range of motion, Decreased strength, Impaired tone, Impaired UE functional use  Visit Diagnosis: Radiculopathy, cervical region  Abnormal posture     Problem List Patient Active Problem List   Diagnosis Date Noted  . Depression   . Hypertension     Percival Spanish, PT, MPT 12/10/2016, 8:48 AM  [facility] [address]  [place], [place], [address] Phone: [phone]   Fax:  [phone]  Name: CHERIL SLATTERY MRN: [id number] Date of Birth: Mar 14, 1961

## 2016-12-12 ENCOUNTER — Ambulatory Visit: Payer: 59

## 2016-12-12 DIAGNOSIS — M5412 Radiculopathy, cervical region: Secondary | ICD-10-CM | POA: Diagnosis not present

## 2016-12-12 DIAGNOSIS — R293 Abnormal posture: Secondary | ICD-10-CM

## 2016-12-12 NOTE — Therapy (Signed)
[facility] [address]  [place], [place], [address] Phone: [phone]   Fax:  [phone]  Physical Therapy Treatment  Patient Details  Name: Sierra Curtis MRN: [id number] Date of Birth: 04/27/1961 Referring Provider: Earleen Newport, MD  Encounter Date: 12/12/2016      PT End of Session - 12/12/16 0805    Visit Number 6   Number of Visits 12   Date for PT Re-Evaluation 01/11/17   Authorization Type [facility] - VL: 53   Authorization - Number of Visits 56   PT Start Time 0801   PT Stop Time 0855  10 min moist heat to end treatment   PT Time Calculation (min) 54 min   Activity Tolerance Patient tolerated treatment well   Behavior During Therapy [facility] for tasks assessed/performed      Past Medical History:  Diagnosis Date  . Depression   . Hypertension     Past Surgical History:  Procedure Laterality Date  . BREAST BIOPSY Right 2005  . BREAST SURGERY  2003  . CESAREAN SECTION    . SPINE SURGERY      There were no vitals filed for this visit.      Subjective Assessment - 12/12/16 0804    Subjective Pt. reporting numbness is still improving and she is pain free today.  Pt. reporting consistent adherence to HEP.  Pt. reporting driving has gotten easier with improved ability to check blind spot.     Patient Stated Goals "for the pain and numbness to go away"   Currently in Pain? No/denies   Pain Score 0-No pain   Multiple Pain Sites No                         [facility] Adult PT Treatment/Exercise - 12/12/16 0812      Neck Exercises: Machines for Strengthening   UBE (Upper Arm Bike) lvl 3.0 fwd/back x 3' each     Shoulder Exercises: Standing   Extension AROM;Strengthening;15 reps  3" hold    Theraband Level (Shoulder Extension) Level 1 (Yellow)   Row AROM;Strengthening;Theraband;15 reps  5" hold    Theraband Level (Shoulder Row) Level 2 (Red)   Other Standing Exercises T-row with  yellow TB 3" x 10 reps   Other Standing Exercises B shoulder extension with yellow TB anchored high in doorframe 3" x 10 reps  cues for scap. squeeze      Shoulder Exercises: ROM/Strengthening   Other ROM/Strengthening Exercises BATCA pulldown 15# 3" x 15 reps   Cues for scap. 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;3 reps  2 sets      Moist Heat Therapy   Number Minutes Moist Heat 10 Minutes   Moist Heat Location Cervical;Shoulder     Manual Therapy   Soft tissue mobilization B UT, LS, & pecs - L>R   Myofascial Release TPR to L UT & LS   Passive ROM Gentle cervical PROM all directions                  PT Short Term Goals - 12/10/16 0830      PT SHORT TERM GOAL #1   Title Independent with initial HEP by 12/14/16   Status Achieved     PT SHORT TERM GOAL #2   Title Pt will verbalize understanding of neutral spine and shoulder posture by 12/14/16   Status Achieved  PT Long Term Goals - 11/30/16 7741      PT LONG TERM GOAL #1   Title Independent with advanced HEP by 01/11/17   Status On-going     PT LONG TERM GOAL #2   Title Pt will routinely demonstrate neutral spine and shoulder posture w/o cues >/= 75% of time by 01/11/17   Status On-going     PT LONG TERM GOAL #3   Title Cervical ROM WFL w/o pain or tightness by 01/11/17 to allow for adequate motion to check blindspot while driving by 01/14/94   Status On-going     PT LONG TERM GOAL #4   Title Pt will report no further numbness or tingling in chest, neck or L UE by 01/11/17   Status On-going     PT LONG TERM GOAL #5   Title Pt will report ability to resume knitting w/o limitation due to neck pain or radicular symptoms by 01/11/17   Status On-going               Plan - 12/12/16 0810    Clinical Impression Statement Pt. reporting numbness continues to improve and pain is less frequent now.  Notes improved neck mobility with driving and more able  to check blind spots, etc. Today's treatment with mild progression in scapular strengthening with introduction to T-row, and lat pulldowns.  Pt. pain free with all therex today however still requiring cueing for full scapular retraction.  Manual STM/TPR continued today to further address tight pecs, and cervical musculature.  Pt. seems to be progressing well at this point.  Moist heat to cervical musculature to end treatment to promote relaxation.  Pt. will continue to benefit from further skilled therapy to improve cervical ROM and decrease pain and radicular symptoms with functional activities.     PT Treatment/Interventions Patient/family education;Neuromuscular re-education;Therapeutic exercise;Manual techniques;Dry needling;Taping;Electrical Stimulation;Moist Heat;Cryotherapy;Iontophoresis 4mg /ml Dexamethasone;Ultrasound;Traction;ADLs/Self Care Home Management   PT Next Visit Plan Postural training - anterior stretching & scapular stabilization; Manual therapy to address increased muscle tension/tightness with possible trial of taping; Modalities PRN      Patient will benefit from skilled therapeutic intervention in order to improve the following deficits and impairments:  Pain, Impaired flexibility, Decreased range of motion, Decreased strength, Impaired tone, Impaired UE functional use  Visit Diagnosis: Radiculopathy, cervical region  Abnormal posture     Problem List Patient Active Problem List   Diagnosis Date Noted  . Depression   . Hypertension     [name], [place] 12/12/16 1:20 PM   [facility] [address]  [place], [place], [address] Phone: [phone]   Fax:  [phone]  Name: Sierra Curtis MRN: [id number] Date of Birth: March 24, 1961

## 2016-12-17 ENCOUNTER — Ambulatory Visit: Payer: 59 | Admitting: Physical Therapy

## 2016-12-19 ENCOUNTER — Ambulatory Visit: Payer: 59

## 2016-12-19 DIAGNOSIS — M5412 Radiculopathy, cervical region: Secondary | ICD-10-CM

## 2016-12-19 DIAGNOSIS — R293 Abnormal posture: Secondary | ICD-10-CM

## 2016-12-19 NOTE — Therapy (Addendum)
[facility] [address]  [place], [place], [address] Phone: [phone]   Fax:  [phone]  Physical Therapy Treatment  Patient Details  Name: Sierra Curtis MRN: [id number] Date of Birth: September 10, 1961 Referring Provider: Earleen Newport, MD  Encounter Date: 12/19/2016      PT End of Session - 12/19/16 0808    Visit Number 7   Number of Visits 12   Date for PT Re-Evaluation 01/11/17   Authorization Type [facility] - VL: 66   Authorization - Number of Visits 60   PT Start Time 0805   PT Stop Time 0835   PT Time Calculation (min) 30 min   Activity Tolerance Patient tolerated treatment well   Behavior During Therapy [facility] for tasks assessed/performed      Past Medical History:  Diagnosis Date  . Depression   . Hypertension     Past Surgical History:  Procedure Laterality Date  . BREAST BIOPSY Right 2005  . BREAST SURGERY  2003  . CESAREAN SECTION    . SPINE SURGERY      There were no vitals filed for this visit.      Subjective Assessment - 12/19/16 0807    Subjective Pt. reporting she hasn't had pain since Sunday while knitting, and hasn't had numbness since last week.     Patient Stated Goals "for the pain and numbness to go away"   Currently in Pain? No/denies   Pain Score 0-No pain   Multiple Pain Sites No            [facility] PT Assessment - 12/19/16 0816      Observation/Other Assessments   Focus on Therapeutic Outcomes (FOTO)  88% (12% limitation)      AROM   AROM Assessment Site Lumbar   Cervical Flexion 38   Cervical Extension 53   Cervical - Right Side Bend 32   Cervical - Left Side Bend 36   Cervical - Right Rotation 54   Cervical - Left Rotation 60                     [facility] Adult PT Treatment/Exercise - 12/19/16 0841      Neck Exercises: Machines for Strengthening   UBE (Upper Arm Bike) lvl 3.5 fwd/back x 3' each     Shoulder Exercises: Standing   Extension  AROM;Strengthening;10 reps;Theraband   Theraband Level (Shoulder Extension) Level 3 (Green)   Row AROM;Strengthening;Theraband;10 reps   Theraband Level (Shoulder Row) Level 3 (Green)     Shoulder Exercises: Stretch   Corner Stretch 30 seconds;2 reps   Other Shoulder Stretches Multidirection chest stretch lying on 1/2 foam bolster 3 x 30 sec each way                  PT Short Term Goals - 12/10/16 0830      PT SHORT TERM GOAL #1   Title Independent with initial HEP by 12/14/16   Status Achieved     PT SHORT TERM GOAL #2   Title Pt will verbalize understanding of neutral spine and shoulder posture by 12/14/16   Status Achieved           PT Long Term Goals - 12/19/16 0811      PT LONG TERM GOAL #1   Title Independent with advanced HEP by 01/11/17   Status Achieved     PT LONG TERM GOAL #2   Title Pt will routinely  demonstrate neutral spine and shoulder posture w/o cues >/= 75% of time by 01/11/17   Status Partially Met  3.28.18: pt. still admitting to occasionally forgetting proper posture at work however able to self correct      PT LONG TERM GOAL #3   Title Cervical ROM WFL w/o pain or tightness by 01/11/17 to allow for adequate motion to check blindspot while driving by 8/33/82   Status Achieved     PT LONG TERM GOAL #4   Title Pt will report no further numbness or tingling in chest, neck or L UE by 01/11/17   Status Achieved  3.28.18: Pt. without numbness or tingling in chest, neck or L UE since last week      PT LONG TERM GOAL #5   Title Pt will report ability to resume knitting w/o limitation due to neck pain or radicular symptoms by 01/11/17   Status Achieved  3.28.18: pt. reports resuming knitting w/o limitation                Plan - 12/19/16 0830    Clinical Impression Statement Pt. reporting she has not felt pain since Sunday and numbness/tingling has been gone since last week.  Pt. improved in all cervical AROM today and reports no difficulty  driving or checking blind spots.  Pt. has resumed knitting without limitation from neck pain or numbness/tingling and notes increased postural awareness at work.  Pt. has now met all LTG's with exception of partially meeting postural awareness goal. Possibility of 30-day hold discussed with pt. and PT with both agreeing to this.   PT Treatment/Interventions Patient/family education;Neuromuscular re-education;Therapeutic exercise;Manual techniques;Dry needling;Taping;Electrical Stimulation;Moist Heat;Cryotherapy;Iontophoresis 30m/ml Dexamethasone;Ultrasound;Traction;ADLs/Self Care Home Management   PT Next Visit Plan pt. on 30 day hold       Patient will benefit from skilled therapeutic intervention in order to improve the following deficits and impairments:  Pain, Impaired flexibility, Decreased range of motion, Decreased strength, Impaired tone, Impaired UE functional use  Visit Diagnosis: Radiculopathy, cervical region  Abnormal posture     Problem List Patient Active Problem List   Diagnosis Date Noted  . Depression   . Hypertension     MBess Harvest PDel[place]03/28/18 8:50 AM  [facility] [place] [address]Phone: [phone]  Fax:  [phone] Name: TELEONOR OCONMRN: [id number] of Birth: [birth year]  PHYSICAL THERAPY DISCHARGE SUMMARY  Visits from Start of Care: 7  Current functional level related to goals / functional outcomes:   Refer to above clinical impression. Pt has not needed to return to PT in >30 days, therefore will proceed with discharge from PT for this episode.   Remaining deficits:   As above.   Education / Equipment:   HEP  Plan: Patient agrees to discharge.  Patient goals were met. Patient is being discharged due to meeting the stated rehab goals.  ?????      JPercival Spanish PT, MPT 02/05/17, 8:18 AM  [facility] [place] [address]Phone: [phone]  Fax:  [phone]

## 2016-12-26 DIAGNOSIS — R21 Rash and other nonspecific skin eruption: Secondary | ICD-10-CM | POA: Diagnosis not present

## 2016-12-26 DIAGNOSIS — Z1283 Encounter for screening for malignant neoplasm of skin: Secondary | ICD-10-CM | POA: Diagnosis not present

## 2016-12-26 DIAGNOSIS — L503 Dermatographic urticaria: Secondary | ICD-10-CM | POA: Diagnosis not present

## 2016-12-26 DIAGNOSIS — D485 Neoplasm of uncertain behavior of skin: Secondary | ICD-10-CM | POA: Diagnosis not present

## 2017-01-02 DIAGNOSIS — M5412 Radiculopathy, cervical region: Secondary | ICD-10-CM | POA: Diagnosis not present

## 2017-03-03 ENCOUNTER — Other Ambulatory Visit: Payer: Self-pay | Admitting: Family Medicine

## 2017-03-04 NOTE — Telephone Encounter (Signed)
apt 

## 2017-04-17 ENCOUNTER — Ambulatory Visit (INDEPENDENT_AMBULATORY_CARE_PROVIDER_SITE_OTHER): Payer: 59 | Admitting: Family Medicine

## 2017-04-17 ENCOUNTER — Encounter: Payer: Self-pay | Admitting: Family Medicine

## 2017-04-17 VITALS — BP 126/83 | HR 84 | Ht 68.31 in | Wt 124.0 lb

## 2017-04-17 DIAGNOSIS — I10 Essential (primary) hypertension: Secondary | ICD-10-CM

## 2017-04-17 DIAGNOSIS — Z131 Encounter for screening for diabetes mellitus: Secondary | ICD-10-CM | POA: Diagnosis not present

## 2017-04-17 DIAGNOSIS — F325 Major depressive disorder, single episode, in full remission: Secondary | ICD-10-CM

## 2017-04-17 DIAGNOSIS — Z1329 Encounter for screening for other suspected endocrine disorder: Secondary | ICD-10-CM | POA: Diagnosis not present

## 2017-04-17 DIAGNOSIS — L658 Other specified nonscarring hair loss: Secondary | ICD-10-CM | POA: Diagnosis not present

## 2017-04-17 DIAGNOSIS — Z13220 Encounter for screening for lipoid disorders: Secondary | ICD-10-CM | POA: Diagnosis not present

## 2017-04-17 DIAGNOSIS — Z0000 Encounter for general adult medical examination without abnormal findings: Secondary | ICD-10-CM | POA: Diagnosis not present

## 2017-04-17 MED ORDER — SERTRALINE HCL 50 MG PO TABS: 50.0000 mg | ORAL_TABLET | Freq: Every morning | ORAL | 4 refills | Status: DC

## 2017-04-17 NOTE — Progress Notes (Signed)
BP 126/83   Pulse 84   Ht 5' 8.31" (1.735 m)   Wt 124 lb (56.2 kg)   SpO2 99%   BMI 18.69 kg/m    Subjective:    Patient ID: Sierra Curtis, female    DOB: 1961/02/11, 56 y.o.   MRN: [id number]  HPI: Sierra Curtis is a 56 y.o. female  Chief Complaint  Patient presents with  . Annual Exam  . Hair/Scalp Problem    Thining.     Patient follow-up blood pressure not taking any medication and blood pressure doing well both home checks and here. Reviewed previous labs and all normal. Patient taking routine supplements of vitamins no other supplements. Nerves doing well on Zoloft. Patient does have hives issues and dermatographia controlled completely with Zyrtec 1 a day with no side effects. Does take vitamin D supplements Stopped hydrochlorothiazide was concerned about hair loss has continued has thinning hair throughout. Relevant past medical, surgical, family and social history reviewed and updated as indicated. Interim medical history since our last visit reviewed. Allergies and medications reviewed and updated.  Review of Systems  Constitutional: Negative.   HENT: Negative.   Eyes: Negative.   Respiratory: Negative.   Cardiovascular: Negative.   Gastrointestinal: Negative.   Endocrine: Negative.   Genitourinary: Negative.   Musculoskeletal: Negative.   Skin: Negative.   Allergic/Immunologic: Negative.   Neurological: Negative.   Hematological: Negative.   Psychiatric/Behavioral: Negative.     Per HPI unless specifically indicated above     Objective:    BP 126/83   Pulse 84   Ht 5' 8.31" (1.735 m)   Wt 124 lb (56.2 kg)   SpO2 99%   BMI 18.69 kg/m   Wt Readings from Last 3 Encounters:  04/17/17 124 lb (56.2 kg)  07/18/16 128 lb (58.1 kg)  01/04/16 123 lb (55.8 kg)    Physical Exam  Constitutional: She is oriented to person, place, and time. She appears well-developed and well-nourished.  HENT:  Head: Normocephalic and atraumatic.  Right Ear:  External ear normal.  Left Ear: External ear normal.  Nose: Nose normal.  Mouth/Throat: Oropharynx is clear and moist.  Eyes: Pupils are equal, round, and reactive to light. Conjunctivae and EOM are normal.  Neck: Normal range of motion. Neck supple. Carotid bruit is not present.  Cardiovascular: Normal rate, regular rhythm and normal heart sounds.   No murmur heard. Pulmonary/Chest: Effort normal and breath sounds normal.  Abdominal: Soft. Bowel sounds are normal. There is no hepatosplenomegaly.  Musculoskeletal: Normal range of motion.  Neurological: She is alert and oriented to person, place, and time.  Skin: No rash noted.  Psychiatric: She has a normal mood and affect. Her behavior is normal. Judgment and thought content normal.    Results for orders placed or performed in visit on 01/04/16  Comprehensive metabolic panel  Result Value Ref Range   Glucose 81 65 - 99 mg/dL   BUN 12 6 - 24 mg/dL   Creatinine, Ser 0.82 0.57 - 1.00 mg/dL   GFR calc non Af Amer 82 >59 mL/min/1.73   GFR calc Af Amer 94 >59 mL/min/1.73   BUN/Creatinine Ratio 15 9 - 23   Sodium 143 134 - 144 mmol/L   Potassium 4.3 3.5 - 5.2 mmol/L   Chloride 100 96 - 106 mmol/L   CO2 29 18 - 29 mmol/L   Calcium 9.5 8.7 - 10.2 mg/dL   Total Protein 6.6 6.0 - 8.5 g/dL   Albumin 4.6  3.5 - 5.5 g/dL   Globulin, Total 2.0 1.5 - 4.5 g/dL   Albumin/Globulin Ratio 2.3 (H) 1.2 - 2.2   Bilirubin Total 0.5 0.0 - 1.2 mg/dL   Alkaline Phosphatase 43 39 - 117 IU/L   AST 18 0 - 40 IU/L   ALT 12 0 - 32 IU/L  Lipid panel  Result Value Ref Range   Cholesterol, Total 173 100 - 199 mg/dL   Triglycerides 115 0 - 149 mg/dL   HDL 54 >39 mg/dL   VLDL Cholesterol Cal 23 5 - 40 mg/dL   LDL Calculated 96 0 - 99 mg/dL   Chol/HDL Ratio 3.2 0.0 - 4.4 ratio units  CBC with Differential/Platelet  Result Value Ref Range   WBC 5.5 3.4 - 10.8 x10E3/uL   RBC 4.06 3.77 - 5.28 x10E6/uL   Hemoglobin 12.7 11.1 - 15.9 g/dL   Hematocrit 38.4  34.0 - 46.6 %   MCV 95 79 - 97 fL   MCH 31.3 26.6 - 33.0 pg   MCHC 33.1 31.5 - 35.7 g/dL   RDW 13.0 12.3 - 15.4 %   Platelets 194 150 - 379 x10E3/uL   Neutrophils 62 %   Lymphs 29 %   Monocytes 7 %   Eos 2 %   Basos 0 %   Neutrophils Absolute 3.4 1.4 - 7.0 x10E3/uL   Lymphocytes Absolute 1.6 0.7 - 3.1 x10E3/uL   Monocytes Absolute 0.4 0.1 - 0.9 x10E3/uL   EOS (ABSOLUTE) 0.1 0.0 - 0.4 x10E3/uL   Basophils Absolute 0.0 0.0 - 0.2 x10E3/uL   Immature Granulocytes 0 %   Immature Grans (Abs) 0.0 0.0 - 0.1 x10E3/uL  TSH  Result Value Ref Range   TSH 1.430 0.450 - 4.500 uIU/mL  Urinalysis, Routine w reflex microscopic (not at [facility])  Result Value Ref Range   Specific Gravity, UA 1.015 1.005 - 1.030   pH, UA 7.0 5.0 - 7.5   Color, UA Yellow Yellow   Appearance Ur Cloudy (A) Clear   Leukocytes, UA Negative Negative   Protein, UA Negative Negative/Trace   Glucose, UA Negative Negative   Ketones, UA Negative Negative   RBC, UA Negative Negative   Bilirubin, UA Negative Negative   Urobilinogen, Ur 0.2 0.2 - 1.0 mg/dL   Nitrite, UA Negative Negative      Assessment & Plan:   Problem List Items Addressed This Visit      Cardiovascular and Mediastinum   RESOLVED: Hypertension - Primary   Relevant Orders   CBC with Differential/Platelet     Musculoskeletal and Integument   Female pattern hair loss    Discuss female pattern hair loss will check labs      Relevant Orders   VITAMIN D 25 Hydroxy (Vit-D Deficiency, Fractures)     Other   Depression    The current medical regimen is effective;  continue present plan and medications.       Relevant Medications   sertraline (ZOLOFT) 50 MG tablet   Other Relevant Orders   VITAMIN D 25 Hydroxy (Vit-D Deficiency, Fractures)    Other Visit Diagnoses    PE (physical exam), annual       Screening for diabetes mellitus (DM)       Relevant Orders   Comprehensive metabolic panel   Urinalysis, Routine w reflex microscopic    Screening cholesterol level       Relevant Orders   Lipid panel   Thyroid disorder screen       Relevant Orders  TSH       Follow up plan: Return in about 6 months (around 10/18/2017) for med check.

## 2017-04-17 NOTE — Assessment & Plan Note (Signed)
Discuss female pattern hair loss will check labs

## 2017-04-17 NOTE — Assessment & Plan Note (Signed)
The current medical regimen is effective;  continue present plan and medications.  

## 2017-04-18 ENCOUNTER — Encounter: Payer: Self-pay | Admitting: Family Medicine

## 2017-04-18 LAB — COMPREHENSIVE METABOLIC PANEL
A/G RATIO: 2 (ref 1.2–2.2)
ALBUMIN: 4.5 g/dL (ref 3.5–5.5)
ALT: 15 IU/L (ref 0–32)
AST: 19 IU/L (ref 0–40)
Alkaline Phosphatase: 48 IU/L (ref 39–117)
BILIRUBIN TOTAL: 0.6 mg/dL (ref 0.0–1.2)
BUN / CREAT RATIO: 14 (ref 9–23)
BUN: 12 mg/dL (ref 6–24)
CALCIUM: 9.4 mg/dL (ref 8.7–10.2)
CHLORIDE: 103 mmol/L (ref 96–106)
CO2: 26 mmol/L (ref 20–29)
Creatinine, Ser: 0.83 mg/dL (ref 0.57–1.00)
GFR, EST AFRICAN AMERICAN: 92 mL/min/{1.73_m2} (ref >59)
GFR, EST NON AFRICAN AMERICAN: 80 mL/min/{1.73_m2} (ref >59)
GLUCOSE: 91 mg/dL (ref 65–99)
Globulin, Total: 2.2 g/dL (ref 1.5–4.5)
Potassium: 4.1 mmol/L (ref 3.5–5.2)
Sodium: 143 mmol/L (ref 134–144)
TOTAL PROTEIN: 6.7 g/dL (ref 6.0–8.5)

## 2017-04-18 LAB — MICROSCOPIC EXAMINATION
CAST TYPE: NONE SEEN
CRYSTALS: NONE SEEN
Casts: NONE SEEN /lpf
Crystal Type: NONE SEEN
MUCUS UA: NONE SEEN
Renal Epithel, UA: NONE SEEN /hpf
Trichomonas, UA: NONE SEEN
YEAST UA: NONE SEEN

## 2017-04-18 LAB — CBC WITH DIFFERENTIAL/PLATELET
BASOS: 0 %
Basophils Absolute: 0 10*3/uL (ref 0.0–0.2)
EOS (ABSOLUTE): 0.1 10*3/uL (ref 0.0–0.4)
Eos: 2 %
HEMOGLOBIN: 13 g/dL (ref 11.1–15.9)
Hematocrit: 37.7 % (ref 34.0–46.6)
IMMATURE GRANS (ABS): 0 10*3/uL (ref 0.0–0.1)
IMMATURE GRANULOCYTES: 0 %
LYMPHS: 26 %
Lymphocytes Absolute: 1.2 10*3/uL (ref 0.7–3.1)
MCH: 32.2 pg (ref 26.6–33.0)
MCHC: 34.5 g/dL (ref 31.5–35.7)
MCV: 93 fL (ref 79–97)
MONOCYTES: 8 %
Monocytes Absolute: 0.4 10*3/uL (ref 0.1–0.9)
NEUTROS ABS: 3 10*3/uL (ref 1.4–7.0)
Neutrophils: 64 %
Platelets: 180 10*3/uL (ref 150–379)
RBC: 4.04 x10E6/uL (ref 3.77–5.28)
RDW: 13.2 % (ref 12.3–15.4)
WBC: 4.7 10*3/uL (ref 3.4–10.8)

## 2017-04-18 LAB — LIPID PANEL
CHOLESTEROL TOTAL: 188 mg/dL (ref 100–199)
Chol/HDL Ratio: 3.3 ratio (ref 0.0–4.4)
HDL: 57 mg/dL (ref >39)
LDL CALC: 113 mg/dL — AB (ref 0–99)
TRIGLYCERIDES: 90 mg/dL (ref 0–149)
VLDL Cholesterol Cal: 18 mg/dL (ref 5–40)

## 2017-04-18 LAB — URINALYSIS, ROUTINE W REFLEX MICROSCOPIC
BILIRUBIN UA: NEGATIVE
Glucose, UA: NEGATIVE
KETONES UA: NEGATIVE
LEUKOCYTES UA: NEGATIVE
NITRITE UA: NEGATIVE
PH UA: 7 (ref 5.0–7.5)
Protein, UA: NEGATIVE
RBC UA: NEGATIVE
Specific Gravity, UA: 1.01 (ref 1.005–1.030)
UUROB: 0.2 mg/dL (ref 0.2–1.0)

## 2017-04-18 LAB — TSH: TSH: 1.91 u[IU]/mL (ref 0.450–4.500)

## 2017-04-18 LAB — VITAMIN D 25 HYDROXY (VIT D DEFICIENCY, FRACTURES): VIT D 25 HYDROXY: 31.9 ng/mL (ref 30.0–100.0)

## 2017-06-25 DIAGNOSIS — Z23 Encounter for immunization: Secondary | ICD-10-CM | POA: Diagnosis not present

## 2017-08-06 DIAGNOSIS — Z23 Encounter for immunization: Secondary | ICD-10-CM | POA: Diagnosis not present

## 2017-10-08 DIAGNOSIS — Z23 Encounter for immunization: Secondary | ICD-10-CM | POA: Diagnosis not present

## 2017-10-21 ENCOUNTER — Encounter: Payer: Self-pay | Admitting: Family Medicine

## 2017-10-21 ENCOUNTER — Ambulatory Visit: Payer: 59 | Admitting: Family Medicine

## 2017-10-21 DIAGNOSIS — F325 Major depressive disorder, single episode, in full remission: Secondary | ICD-10-CM | POA: Diagnosis not present

## 2017-10-21 DIAGNOSIS — L658 Other specified nonscarring hair loss: Secondary | ICD-10-CM | POA: Diagnosis not present

## 2017-10-21 NOTE — Progress Notes (Signed)
BP 130/82   Pulse 62   Wt 132 lb 14.4 oz (60.3 kg)   SpO2 98%   BMI 20.03 kg/m    Subjective:    Patient ID: Sierra Curtis, female    DOB: [birth year], 57 y.o.   MRN: [id number]  HPI: Sierra Curtis is a 57 y.o. female  Chief Complaint  Patient presents with  . Follow-up  Blood pressure in good control of blood pressure.  Not taking any blood pressure meds Depression doing well interested in going ahead and stop medication. No complaints from medications or further depression symptoms. Reviewed hair loss which is stabilized.  Has stopped hydrochlorothiazide with good blood pressure control.  Loss is stable. Relevant past medical, surgical, family and social history reviewed and updated as indicated. Interim medical history since our last visit reviewed. Allergies and medications reviewed and updated.  Review of Systems  Constitutional: Negative.   Respiratory: Negative.   Cardiovascular: Negative.     Per HPI unless specifically indicated above     Objective:    BP 130/82   Pulse 62   Wt 132 lb 14.4 oz (60.3 kg)   SpO2 98%   BMI 20.03 kg/m   Wt Readings from Last 3 Encounters:  10/21/17 132 lb 14.4 oz (60.3 kg)  04/17/17 124 lb (56.2 kg)  07/18/16 128 lb (58.1 kg)    Physical Exam  Constitutional: She is oriented to person, place, and time. She appears well-developed and well-nourished.  HENT:  Head: Normocephalic and atraumatic.  Eyes: Conjunctivae and EOM are normal.  Neck: Normal range of motion.  Cardiovascular: Normal rate, regular rhythm and normal heart sounds.  Pulmonary/Chest: Effort normal and breath sounds normal.  Musculoskeletal: Normal range of motion.  Neurological: She is alert and oriented to person, place, and time.  Skin: No erythema.  Psychiatric: She has a normal mood and affect. Her behavior is normal. Judgment and thought content normal.    Results for orders placed or performed in visit on 04/17/17  Microscopic Examination    Result Value Ref Range   WBC, UA 0-5 0 - 5 /hpf   RBC, UA 0-2 0 - 2 /hpf   Epithelial Cells (non renal) 0-10 0 - 10 /hpf   Renal Epithel, UA None seen None seen /hpf   Casts None seen None seen /lpf   Cast Type None seen N/A   Crystals None seen N/A   Crystal Type None seen N/A   Mucus, UA None seen Not Estab.   Bacteria, UA Few None seen/Few   Yeast, UA None seen None seen   Trichomonas, UA None seen None seen  CBC with Differential/Platelet  Result Value Ref Range   WBC 4.7 3.4 - 10.8 x10E3/uL   RBC 4.04 3.77 - 5.28 x10E6/uL   Hemoglobin 13.0 11.1 - 15.9 g/dL   Hematocrit 37.7 34.0 - 46.6 %   MCV 93 79 - 97 fL   MCH 32.2 26.6 - 33.0 pg   MCHC 34.5 31.5 - 35.7 g/dL   RDW 13.2 12.3 - 15.4 %   Platelets 180 150 - 379 x10E3/uL   Neutrophils 64 Not Estab. %   Lymphs 26 Not Estab. %   Monocytes 8 Not Estab. %   Eos 2 Not Estab. %   Basos 0 Not Estab. %   Neutrophils Absolute 3.0 1.4 - 7.0 x10E3/uL   Lymphocytes Absolute 1.2 0.7 - 3.1 x10E3/uL   Monocytes Absolute 0.4 0.1 - 0.9 x10E3/uL   EOS (ABSOLUTE)  0.1 0.0 - 0.4 x10E3/uL   Basophils Absolute 0.0 0.0 - 0.2 x10E3/uL   Immature Granulocytes 0 Not Estab. %   Immature Grans (Abs) 0.0 0.0 - 0.1 x10E3/uL  Comprehensive metabolic panel  Result Value Ref Range   Glucose 91 65 - 99 mg/dL   BUN 12 6 - 24 mg/dL   Creatinine, Ser 0.83 0.57 - 1.00 mg/dL   GFR calc non Af Amer 80 >59 mL/min/1.73   GFR calc Af Amer 92 >59 mL/min/1.73   BUN/Creatinine Ratio 14 9 - 23   Sodium 143 134 - 144 mmol/L   Potassium 4.1 3.5 - 5.2 mmol/L   Chloride 103 96 - 106 mmol/L   CO2 26 20 - 29 mmol/L   Calcium 9.4 8.7 - 10.2 mg/dL   Total Protein 6.7 6.0 - 8.5 g/dL   Albumin 4.5 3.5 - 5.5 g/dL   Globulin, Total 2.2 1.5 - 4.5 g/dL   Albumin/Globulin Ratio 2.0 1.2 - 2.2   Bilirubin Total 0.6 0.0 - 1.2 mg/dL   Alkaline Phosphatase 48 39 - 117 IU/L   AST 19 0 - 40 IU/L   ALT 15 0 - 32 IU/L  Lipid panel  Result Value Ref Range   Cholesterol,  Total 188 100 - 199 mg/dL   Triglycerides 90 0 - 149 mg/dL   HDL 57 >39 mg/dL   VLDL Cholesterol Cal 18 5 - 40 mg/dL   LDL Calculated 113 (H) 0 - 99 mg/dL   Chol/HDL Ratio 3.3 0.0 - 4.4 ratio  TSH  Result Value Ref Range   TSH 1.910 0.450 - 4.500 uIU/mL  Urinalysis, Routine w reflex microscopic  Result Value Ref Range   Specific Gravity, UA 1.010 1.005 - 1.030   pH, UA 7.0 5.0 - 7.5   Color, UA Yellow Yellow   Appearance Ur Clear Clear   Leukocytes, UA Negative Negative   Protein, UA Negative Negative/Trace   Glucose, UA Negative Negative   Ketones, UA Negative Negative   RBC, UA Negative Negative   Bilirubin, UA Negative Negative   Urobilinogen, Ur 0.2 0.2 - 1.0 mg/dL   Nitrite, UA Negative Negative   Microscopic Examination See below:   VITAMIN D 25 Hydroxy (Vit-D Deficiency, Fractures)  Result Value Ref Range   Vit D, 25-Hydroxy 31.9 30.0 - 100.0 ng/mL      Assessment & Plan:   Problem List Items Addressed This Visit      Musculoskeletal and Integument   Female pattern hair loss    The current medical regimen is effective;  continue present plan and medications.         Other   Depression    Discussed this spring stopping medication by taking 25 mg for a month or so then every other day for a month or so then discontinuing.  Patient will enlist friends family member observation of depression          Follow up plan: Return in about 6 months (around 04/20/2018) for Physical Exam.

## 2017-10-21 NOTE — Assessment & Plan Note (Signed)
The current medical regimen is effective;  continue present plan and medications.  

## 2017-10-21 NOTE — Assessment & Plan Note (Signed)
Discussed this spring stopping medication by taking 25 mg for a month or so then every other day for a month or so then discontinuing.  Patient will enlist friends family member observation of depression

## 2017-11-12 ENCOUNTER — Other Ambulatory Visit: Payer: Self-pay | Admitting: Obstetrics & Gynecology

## 2017-11-12 DIAGNOSIS — Z1211 Encounter for screening for malignant neoplasm of colon: Secondary | ICD-10-CM | POA: Diagnosis not present

## 2017-11-12 DIAGNOSIS — Z01419 Encounter for gynecological examination (general) (routine) without abnormal findings: Secondary | ICD-10-CM | POA: Diagnosis not present

## 2017-11-12 DIAGNOSIS — Z1231 Encounter for screening mammogram for malignant neoplasm of breast: Secondary | ICD-10-CM

## 2017-12-02 ENCOUNTER — Ambulatory Visit
Admission: RE | Admit: 2017-12-02 | Discharge: 2017-12-02 | Disposition: A | Discharge status: Home / Self Care | Payer: 59 | Source: Ambulatory Visit | Attending: Obstetrics & Gynecology | Admitting: Obstetrics & Gynecology

## 2017-12-02 DIAGNOSIS — Z1231 Encounter for screening mammogram for malignant neoplasm of breast: Secondary | ICD-10-CM | POA: Diagnosis not present

## 2018-02-26 ENCOUNTER — Encounter: Payer: Self-pay | Admitting: Family Medicine

## 2018-04-29 ENCOUNTER — Ambulatory Visit: Payer: 59 | Admitting: Family Medicine

## 2018-04-30 ENCOUNTER — Ambulatory Visit: Payer: 59 | Admitting: Family Medicine

## 2018-04-30 ENCOUNTER — Encounter: Payer: Self-pay | Admitting: Family Medicine

## 2018-04-30 VITALS — BP 134/68 | HR 53 | Ht 64.0 in | Wt 130.0 lb

## 2018-04-30 DIAGNOSIS — Z114 Encounter for screening for human immunodeficiency virus [HIV]: Secondary | ICD-10-CM | POA: Diagnosis not present

## 2018-04-30 DIAGNOSIS — F325 Major depressive disorder, single episode, in full remission: Secondary | ICD-10-CM

## 2018-04-30 DIAGNOSIS — Z131 Encounter for screening for diabetes mellitus: Secondary | ICD-10-CM

## 2018-04-30 DIAGNOSIS — Z13220 Encounter for screening for lipoid disorders: Secondary | ICD-10-CM

## 2018-04-30 DIAGNOSIS — I10 Essential (primary) hypertension: Secondary | ICD-10-CM

## 2018-04-30 DIAGNOSIS — Z1159 Encounter for screening for other viral diseases: Secondary | ICD-10-CM

## 2018-04-30 DIAGNOSIS — Z1329 Encounter for screening for other suspected endocrine disorder: Secondary | ICD-10-CM

## 2018-04-30 LAB — URINALYSIS, ROUTINE W REFLEX MICROSCOPIC
Bilirubin, UA: NEGATIVE
GLUCOSE, UA: NEGATIVE
KETONES UA: NEGATIVE
LEUKOCYTES UA: NEGATIVE
Nitrite, UA: NEGATIVE
Protein, UA: NEGATIVE
RBC, UA: NEGATIVE
Specific Gravity, UA: 1.01 (ref 1.005–1.030)
UUROB: 0.2 mg/dL (ref 0.2–1.0)
pH, UA: 7 (ref 5.0–7.5)

## 2018-04-30 MED ORDER — SERTRALINE HCL 50 MG PO TABS: 50.0000 mg | ORAL_TABLET | Freq: Every morning | ORAL | 4 refills | Status: DC

## 2018-04-30 MED ORDER — DIAZEPAM 5 MG PO TABS: 5.0000 mg | ORAL_TABLET | Freq: Two times a day (BID) | ORAL | 0 refills | Status: AC | PRN

## 2018-04-30 NOTE — Assessment & Plan Note (Addendum)
The current medical regimen is effective;  continue present plan and medications.  

## 2018-04-30 NOTE — Progress Notes (Signed)
BP 134/68   Pulse (!) 53   Ht 5\' 4"  (1.626 m)   Wt 130 lb (59 kg)   SpO2 100%   BMI 22.31 kg/m    Subjective:    Patient ID: Sierra Curtis, female    DOB: 23-Sep-1961, 57 y.o.   MRN: [id number]  HPI: Sierra Curtis is a 57 y.o. female  Annual exam  Chief Complaint  Patient presents with  . Follow-up  . Alopecia    Same.   Marland Kitchen Headache    x 2 day   Reviewed patient's been to the beach last week now having some sinus headache drainage missed Zyrtec one day has been taking some Zyrtec migraine medication. Otherwise been doing well.  Relevant past medical, surgical, family and social history reviewed and updated as indicated. Interim medical history since our last visit reviewed. Allergies and medications reviewed and updated.  Review of Systems  Constitutional: Negative.   HENT: Negative.   Eyes: Negative.   Respiratory: Negative.   Cardiovascular: Negative.   Gastrointestinal: Negative.   Endocrine: Negative.   Genitourinary: Negative.   Musculoskeletal: Negative.   Skin: Negative.   Allergic/Immunologic: Negative.   Neurological: Negative.   Hematological: Negative.   Psychiatric/Behavioral: Negative.     Per HPI unless specifically indicated above     Objective:    BP 134/68   Pulse (!) 53   Ht 5\' 4"  (1.626 m)   Wt 130 lb (59 kg)   SpO2 100%   BMI 22.31 kg/m   Wt Readings from Last 3 Encounters:  04/30/18 130 lb (59 kg)  10/21/17 132 lb 14.4 oz (60.3 kg)  04/17/17 124 lb (56.2 kg)    Physical Exam  Constitutional: She is oriented to person, place, and time. She appears well-developed and well-nourished.  HENT:  Head: Normocephalic and atraumatic.  Right Ear: External ear normal.  Left Ear: External ear normal.  Nose: Nose normal.  Mouth/Throat: Oropharynx is clear and moist.  Eyes: Pupils are equal, round, and reactive to light. Conjunctivae and EOM are normal.  Neck: Normal range of motion. Neck supple. Carotid bruit is not present.    Cardiovascular: Normal rate, regular rhythm and normal heart sounds.  No murmur heard. Pulmonary/Chest: Effort normal and breath sounds normal.  Abdominal: Soft. Bowel sounds are normal. There is no hepatosplenomegaly.  Musculoskeletal: Normal range of motion.  Neurological: She is alert and oriented to person, place, and time.  Skin: No rash noted.  Psychiatric: She has a normal mood and affect. Her behavior is normal. Judgment and thought content normal.    Results for orders placed or performed in visit on 04/17/17  Microscopic Examination  Result Value Ref Range   WBC, UA 0-5 0 - 5 /hpf   RBC, UA 0-2 0 - 2 /hpf   Epithelial Cells (non renal) 0-10 0 - 10 /hpf   Renal Epithel, UA None seen None seen /hpf   Casts None seen None seen /lpf   Cast Type None seen N/A   Crystals None seen N/A   Crystal Type None seen N/A   Mucus, UA None seen Not Estab.   Bacteria, UA Few None seen/Few   Yeast, UA None seen None seen   Trichomonas, UA None seen None seen  CBC with Differential/Platelet  Result Value Ref Range   WBC 4.7 3.4 - 10.8 x10E3/uL   RBC 4.04 3.77 - 5.28 x10E6/uL   Hemoglobin 13.0 11.1 - 15.9 g/dL   Hematocrit 37.7 34.0 -  46.6 %   MCV 93 79 - 97 fL   MCH 32.2 26.6 - 33.0 pg   MCHC 34.5 31.5 - 35.7 g/dL   RDW 13.2 12.3 - 15.4 %   Platelets 180 150 - 379 x10E3/uL   Neutrophils 64 Not Estab. %   Lymphs 26 Not Estab. %   Monocytes 8 Not Estab. %   Eos 2 Not Estab. %   Basos 0 Not Estab. %   Neutrophils Absolute 3.0 1.4 - 7.0 x10E3/uL   Lymphocytes Absolute 1.2 0.7 - 3.1 x10E3/uL   Monocytes Absolute 0.4 0.1 - 0.9 x10E3/uL   EOS (ABSOLUTE) 0.1 0.0 - 0.4 x10E3/uL   Basophils Absolute 0.0 0.0 - 0.2 x10E3/uL   Immature Granulocytes 0 Not Estab. %   Immature Grans (Abs) 0.0 0.0 - 0.1 x10E3/uL  Comprehensive metabolic panel  Result Value Ref Range   Glucose 91 65 - 99 mg/dL   BUN 12 6 - 24 mg/dL   Creatinine, Ser 0.83 0.57 - 1.00 mg/dL   GFR calc non Af Amer 80 >59  mL/min/1.73   GFR calc Af Amer 92 >59 mL/min/1.73   BUN/Creatinine Ratio 14 9 - 23   Sodium 143 134 - 144 mmol/L   Potassium 4.1 3.5 - 5.2 mmol/L   Chloride 103 96 - 106 mmol/L   CO2 26 20 - 29 mmol/L   Calcium 9.4 8.7 - 10.2 mg/dL   Total Protein 6.7 6.0 - 8.5 g/dL   Albumin 4.5 3.5 - 5.5 g/dL   Globulin, Total 2.2 1.5 - 4.5 g/dL   Albumin/Globulin Ratio 2.0 1.2 - 2.2   Bilirubin Total 0.6 0.0 - 1.2 mg/dL   Alkaline Phosphatase 48 39 - 117 IU/L   AST 19 0 - 40 IU/L   ALT 15 0 - 32 IU/L  Lipid panel  Result Value Ref Range   Cholesterol, Total 188 100 - 199 mg/dL   Triglycerides 90 0 - 149 mg/dL   HDL 57 >39 mg/dL   VLDL Cholesterol Cal 18 5 - 40 mg/dL   LDL Calculated 113 (H) 0 - 99 mg/dL   Chol/HDL Ratio 3.3 0.0 - 4.4 ratio  TSH  Result Value Ref Range   TSH 1.910 0.450 - 4.500 uIU/mL  Urinalysis, Routine w reflex microscopic  Result Value Ref Range   Specific Gravity, UA 1.010 1.005 - 1.030   pH, UA 7.0 5.0 - 7.5   Color, UA Yellow Yellow   Appearance Ur Clear Clear   Leukocytes, UA Negative Negative   Protein, UA Negative Negative/Trace   Glucose, UA Negative Negative   Ketones, UA Negative Negative   RBC, UA Negative Negative   Bilirubin, UA Negative Negative   Urobilinogen, Ur 0.2 0.2 - 1.0 mg/dL   Nitrite, UA Negative Negative   Microscopic Examination See below:   VITAMIN D 25 Hydroxy (Vit-D Deficiency, Fractures)  Result Value Ref Range   Vit D, 25-Hydroxy 31.9 30.0 - 100.0 ng/mL      Assessment & Plan:   Problem List Items Addressed This Visit      Other   Depression    .Marland KitchenThe current medical regimen is effective;  continue present plan and medications.        Relevant Medications   diazepam (VALIUM) 5 MG tablet   sertraline (ZOLOFT) 50 MG tablet    Other Visit Diagnoses    Encounter for screening for HIV    -  Primary   Relevant Orders   HIV antibody (with reflex)  Need for hepatitis C screening test       Relevant Orders   Hepatitis C  Antibody   Essential hypertension       Relevant Orders   CBC with Differential/Platelet   Comprehensive metabolic panel   Lipid panel   Urinalysis, Routine w reflex microscopic   Screening for diabetes mellitus (DM)       Relevant Orders   CBC with Differential/Platelet   Comprehensive metabolic panel   Lipid panel   Urinalysis, Routine w reflex microscopic   Screening cholesterol level       Relevant Orders   CBC with Differential/Platelet   Comprehensive metabolic panel   Lipid panel   Urinalysis, Routine w reflex microscopic   Thyroid disorder screen       Relevant Orders   TSH       Follow up plan: Return in about 6 months (around 10/31/2018) for Med check.

## 2018-05-01 ENCOUNTER — Encounter: Payer: Self-pay | Admitting: Family Medicine

## 2018-05-01 LAB — CBC WITH DIFFERENTIAL/PLATELET
BASOS ABS: 0 10*3/uL (ref 0.0–0.2)
Basos: 0 %
EOS (ABSOLUTE): 0.1 10*3/uL (ref 0.0–0.4)
Eos: 3 %
Hematocrit: 38.3 % (ref 34.0–46.6)
Hemoglobin: 12.6 g/dL (ref 11.1–15.9)
IMMATURE GRANS (ABS): 0 10*3/uL (ref 0.0–0.1)
IMMATURE GRANULOCYTES: 0 %
LYMPHS: 25 %
Lymphocytes Absolute: 1.4 10*3/uL (ref 0.7–3.1)
MCH: 32.6 pg (ref 26.6–33.0)
MCHC: 32.9 g/dL (ref 31.5–35.7)
MCV: 99 fL — ABNORMAL HIGH (ref 79–97)
Monocytes Absolute: 0.4 10*3/uL (ref 0.1–0.9)
Monocytes: 7 %
NEUTROS PCT: 65 %
Neutrophils Absolute: 3.6 10*3/uL (ref 1.4–7.0)
PLATELETS: 201 10*3/uL (ref 150–450)
RBC: 3.87 x10E6/uL (ref 3.77–5.28)
RDW: 13.2 % (ref 12.3–15.4)
WBC: 5.5 10*3/uL (ref 3.4–10.8)

## 2018-05-01 LAB — COMPREHENSIVE METABOLIC PANEL
ALT: 22 IU/L (ref 0–32)
AST: 26 IU/L (ref 0–40)
Albumin/Globulin Ratio: 2 (ref 1.2–2.2)
Albumin: 4.5 g/dL (ref 3.5–5.5)
Alkaline Phosphatase: 47 IU/L (ref 39–117)
BUN/Creatinine Ratio: 13 (ref 9–23)
BUN: 12 mg/dL (ref 6–24)
Bilirubin Total: 0.5 mg/dL (ref 0.0–1.2)
CALCIUM: 9.5 mg/dL (ref 8.7–10.2)
CHLORIDE: 105 mmol/L (ref 96–106)
CO2: 23 mmol/L (ref 20–29)
Creatinine, Ser: 0.93 mg/dL (ref 0.57–1.00)
GFR, EST AFRICAN AMERICAN: 80 mL/min/{1.73_m2} (ref >59)
GFR, EST NON AFRICAN AMERICAN: 69 mL/min/{1.73_m2} (ref >59)
GLUCOSE: 92 mg/dL (ref 65–99)
Globulin, Total: 2.3 g/dL (ref 1.5–4.5)
Potassium: 4.8 mmol/L (ref 3.5–5.2)
Sodium: 142 mmol/L (ref 134–144)
TOTAL PROTEIN: 6.8 g/dL (ref 6.0–8.5)

## 2018-05-01 LAB — LIPID PANEL
CHOL/HDL RATIO: 3.5 ratio (ref 0.0–4.4)
Cholesterol, Total: 219 mg/dL — ABNORMAL HIGH (ref 100–199)
HDL: 62 mg/dL (ref >39)
LDL Calculated: 138 mg/dL — ABNORMAL HIGH (ref 0–99)
Triglycerides: 97 mg/dL (ref 0–149)
VLDL CHOLESTEROL CAL: 19 mg/dL (ref 5–40)

## 2018-05-01 LAB — TSH: TSH: 2.34 u[IU]/mL (ref 0.450–4.500)

## 2018-05-01 LAB — HIV ANTIBODY (ROUTINE TESTING W REFLEX): HIV Screen 4th Generation wRfx: NONREACTIVE

## 2018-05-01 LAB — HEPATITIS C ANTIBODY: Hep C Virus Ab: <0.1 s/co ratio (ref 0.0–0.9)

## 2018-06-27 DIAGNOSIS — Z23 Encounter for immunization: Secondary | ICD-10-CM | POA: Diagnosis not present

## 2018-08-13 DIAGNOSIS — Z86010 Personal history of colonic polyps: Secondary | ICD-10-CM | POA: Diagnosis not present

## 2018-09-04 DIAGNOSIS — K640 First degree hemorrhoids: Secondary | ICD-10-CM | POA: Diagnosis not present

## 2018-09-04 DIAGNOSIS — Z86010 Personal history of colonic polyps: Secondary | ICD-10-CM | POA: Diagnosis not present

## 2018-09-04 LAB — HM COLONOSCOPY

## 2018-09-05 DIAGNOSIS — L400 Psoriasis vulgaris: Secondary | ICD-10-CM | POA: Diagnosis not present

## 2018-10-28 ENCOUNTER — Encounter: Payer: Self-pay | Admitting: Family Medicine

## 2018-10-28 ENCOUNTER — Ambulatory Visit: Payer: 59 | Admitting: Family Medicine

## 2018-10-28 VITALS — BP 121/72 | HR 63 | Temp 98.3°F | Ht 68.0 in | Wt 131.6 lb

## 2018-10-28 DIAGNOSIS — J0100 Acute maxillary sinusitis, unspecified: Secondary | ICD-10-CM

## 2018-10-28 MED ORDER — AMOXICILLIN-POT CLAVULANATE 875-125 MG PO TABS: 1.0000 | ORAL_TABLET | Freq: Two times a day (BID) | ORAL | 0 refills | Status: DC

## 2018-10-28 MED ORDER — BENZONATATE 200 MG PO CAPS: 200.0000 mg | ORAL_CAPSULE | Freq: Three times a day (TID) | ORAL | 0 refills | Status: DC | PRN

## 2018-10-28 NOTE — Progress Notes (Signed)
BP 121/72 (BP Location: Right Arm, Patient Position: Sitting, Cuff Size: Normal)   Pulse 63   Temp 98.3 F (36.8 C) (Oral)   Ht 5\' 8"  (1.727 m)   Wt 131 lb 9.6 oz (59.7 kg)   SpO2 100%   BMI 20.01 kg/m    Subjective:    Patient ID: Sierra Curtis, female    DOB: 1960/11/23, 58 y.o.   MRN: [id number]  HPI: Sierra Curtis is a 58 y.o. female  Chief Complaint  Patient presents with  . Sinusitis    Ongoing 4 weeks.  . Nasal Congestion    Green/Bloody Sputum  . Cough    Dry cough  . Headache   Dry cough, headache, malaise, facial pain and pressure, bloody and green sputum, nasal congestion x 3 weeks. Denies fever, chills, sweats, CP, SOB. Trying mucinex with no relief. Thinks she has a touch of seasonal allergies but nothing that's ever been a problem. Does take zyrtec daily.  Several sick contacts.   Relevant past medical, surgical, family and social history reviewed and updated as indicated. Interim medical history since our last visit reviewed. Allergies and medications reviewed and updated.  Review of Systems  Per HPI unless specifically indicated above     Objective:    BP 121/72 (BP Location: Right Arm, Patient Position: Sitting, Cuff Size: Normal)   Pulse 63   Temp 98.3 F (36.8 C) (Oral)   Ht 5\' 8"  (1.727 m)   Wt 131 lb 9.6 oz (59.7 kg)   SpO2 100%   BMI 20.01 kg/m   Wt Readings from Last 3 Encounters:  10/28/18 131 lb 9.6 oz (59.7 kg)  04/30/18 130 lb (59 kg)  10/21/17 132 lb 14.4 oz (60.3 kg)    Physical Exam Vitals signs and nursing note reviewed.  Constitutional:      Appearance: Normal appearance.  HENT:     Head: Atraumatic.     Comments: B/l maxillary sinuses ttp    Right Ear: Tympanic membrane and external ear normal.     Left Ear: Tympanic membrane and external ear normal.     Nose: Congestion present.     Mouth/Throat:     Mouth: Mucous membranes are moist.     Pharynx: Posterior oropharyngeal erythema present.  Eyes:     Extraocular  Movements: Extraocular movements intact.     Conjunctiva/sclera: Conjunctivae normal.  Neck:     Musculoskeletal: Normal range of motion and neck supple.  Cardiovascular:     Rate and Rhythm: Normal rate and regular rhythm.     Heart sounds: Normal heart sounds.  Pulmonary:     Effort: Pulmonary effort is normal.     Breath sounds: Normal breath sounds. No wheezing.  Musculoskeletal: Normal range of motion.  Skin:    General: Skin is warm and dry.  Neurological:     Mental Status: She is alert and oriented to person, place, and time.  Psychiatric:        Mood and Affect: Mood normal.        Thought Content: Thought content normal.     Results for orders placed or performed in visit on 04/30/18  CBC with Differential/Platelet  Result Value Ref Range   WBC 5.5 3.4 - 10.8 x10E3/uL   RBC 3.87 3.77 - 5.28 x10E6/uL   Hemoglobin 12.6 11.1 - 15.9 g/dL   Hematocrit 38.3 34.0 - 46.6 %   MCV 99 (H) 79 - 97 fL   MCH 32.6 26.6 -  33.0 pg   MCHC 32.9 31.5 - 35.7 g/dL   RDW 13.2 12.3 - 15.4 %   Platelets 201 150 - 450 x10E3/uL   Neutrophils 65 Not Estab. %   Lymphs 25 Not Estab. %   Monocytes 7 Not Estab. %   Eos 3 Not Estab. %   Basos 0 Not Estab. %   Neutrophils Absolute 3.6 1.4 - 7.0 x10E3/uL   Lymphocytes Absolute 1.4 0.7 - 3.1 x10E3/uL   Monocytes Absolute 0.4 0.1 - 0.9 x10E3/uL   EOS (ABSOLUTE) 0.1 0.0 - 0.4 x10E3/uL   Basophils Absolute 0.0 0.0 - 0.2 x10E3/uL   Immature Granulocytes 0 Not Estab. %   Immature Grans (Abs) 0.0 0.0 - 0.1 x10E3/uL  Comprehensive metabolic panel  Result Value Ref Range   Glucose 92 65 - 99 mg/dL   BUN 12 6 - 24 mg/dL   Creatinine, Ser 0.93 0.57 - 1.00 mg/dL   GFR calc non Af Amer 69 >59 mL/min/1.73   GFR calc Af Amer 80 >59 mL/min/1.73   BUN/Creatinine Ratio 13 9 - 23   Sodium 142 134 - 144 mmol/L   Potassium 4.8 3.5 - 5.2 mmol/L   Chloride 105 96 - 106 mmol/L   CO2 23 20 - 29 mmol/L   Calcium 9.5 8.7 - 10.2 mg/dL   Total Protein 6.8 6.0 -  8.5 g/dL   Albumin 4.5 3.5 - 5.5 g/dL   Globulin, Total 2.3 1.5 - 4.5 g/dL   Albumin/Globulin Ratio 2.0 1.2 - 2.2   Bilirubin Total 0.5 0.0 - 1.2 mg/dL   Alkaline Phosphatase 47 39 - 117 IU/L   AST 26 0 - 40 IU/L   ALT 22 0 - 32 IU/L  Lipid panel  Result Value Ref Range   Cholesterol, Total 219 (H) 100 - 199 mg/dL   Triglycerides 97 0 - 149 mg/dL   HDL 62 >39 mg/dL   VLDL Cholesterol Cal 19 5 - 40 mg/dL   LDL Calculated 138 (H) 0 - 99 mg/dL   Chol/HDL Ratio 3.5 0.0 - 4.4 ratio  TSH  Result Value Ref Range   TSH 2.340 0.450 - 4.500 uIU/mL  Urinalysis, Routine w reflex microscopic  Result Value Ref Range   Specific Gravity, UA 1.010 1.005 - 1.030   pH, UA 7.0 5.0 - 7.5   Color, UA Yellow Yellow   Appearance Ur Clear Clear   Leukocytes, UA Negative Negative   Protein, UA Negative Negative/Trace   Glucose, UA Negative Negative   Ketones, UA Negative Negative   RBC, UA Negative Negative   Bilirubin, UA Negative Negative   Urobilinogen, Ur 0.2 0.2 - 1.0 mg/dL   Nitrite, UA Negative Negative  HIV antibody (with reflex)  Result Value Ref Range   HIV Screen 4th Generation wRfx Non Reactive Non Reactive  Hepatitis C Antibody  Result Value Ref Range   Hep C Virus Ab <0.1 0.0 - 0.9 s/co ratio      Assessment & Plan:   Problem List Items Addressed This Visit    None    Visit Diagnoses    Acute maxillary sinusitis, recurrence not specified    -  Primary   Will tx with augmentin, mucinex, sinus rinses. Supportive care and return precautions reviewed. F/u if worsening or not improving   Relevant Medications   amoxicillin-clavulanate (AUGMENTIN) 875-125 MG tablet   benzonatate (TESSALON) 200 MG capsule       Follow up plan: Return if symptoms worsen or fail to improve.

## 2018-11-05 ENCOUNTER — Ambulatory Visit: Payer: 59 | Admitting: Family Medicine

## 2018-11-05 ENCOUNTER — Encounter: Payer: Self-pay | Admitting: Family Medicine

## 2018-11-05 DIAGNOSIS — F325 Major depressive disorder, single episode, in full remission: Secondary | ICD-10-CM

## 2018-11-05 DIAGNOSIS — Z780 Asymptomatic menopausal state: Secondary | ICD-10-CM | POA: Diagnosis not present

## 2018-11-05 DIAGNOSIS — F419 Anxiety disorder, unspecified: Secondary | ICD-10-CM | POA: Insufficient documentation

## 2018-11-05 NOTE — Progress Notes (Signed)
BP 124/65 (BP Location: Left Arm, Patient Position: Sitting, Cuff Size: Normal)   Pulse 60   Temp 98.2 F (36.8 C) (Oral)   Ht 5\' 8"  (1.727 m)   Wt 131 lb (59.4 kg)   SpO2 98%   BMI 19.92 kg/m    Subjective:    Patient ID: Sierra Curtis, female    DOB: 05/02/1961, 58 y.o.   MRN: [id number]  HPI: Sierra Curtis is a 58 y.o. female  Chief Complaint  Patient presents with  . Depression    No Complaints  . Medication Management   Patient all in all doing well with no complaints.  Taking Zoloft without problems and good resolution of depression. Valium has only taken 2 tablets from prescription given 6 months ago.  Rare anxiety use. Patient also some concern about taking estrogen chronically for severe hot flashes.  This was reviewed risk benefit with patient  Relevant past medical, surgical, family and social history reviewed and updated as indicated. Interim medical history since our last visit reviewed. Allergies and medications reviewed and updated.  Review of Systems  Constitutional: Negative.   Respiratory: Negative.   Cardiovascular: Negative.     Per HPI unless specifically indicated above     Objective:    BP 124/65 (BP Location: Left Arm, Patient Position: Sitting, Cuff Size: Normal)   Pulse 60   Temp 98.2 F (36.8 C) (Oral)   Ht 5\' 8"  (1.727 m)   Wt 131 lb (59.4 kg)   SpO2 98%   BMI 19.92 kg/m   Wt Readings from Last 3 Encounters:  11/05/18 131 lb (59.4 kg)  10/28/18 131 lb 9.6 oz (59.7 kg)  04/30/18 130 lb (59 kg)    Physical Exam Constitutional:      Appearance: She is well-developed.  HENT:     Head: Normocephalic and atraumatic.  Eyes:     Conjunctiva/sclera: Conjunctivae normal.  Neck:     Musculoskeletal: Normal range of motion.  Cardiovascular:     Rate and Rhythm: Normal rate and regular rhythm.     Heart sounds: Normal heart sounds.  Pulmonary:     Effort: Pulmonary effort is normal.     Breath sounds: Normal breath sounds.    Musculoskeletal: Normal range of motion.  Skin:    Findings: No erythema.  Neurological:     Mental Status: She is alert and oriented to person, place, and time.  Psychiatric:        Behavior: Behavior normal.        Thought Content: Thought content normal.        Judgment: Judgment normal.     Results for orders placed or performed in visit on 04/30/18  CBC with Differential/Platelet  Result Value Ref Range   WBC 5.5 3.4 - 10.8 x10E3/uL   RBC 3.87 3.77 - 5.28 x10E6/uL   Hemoglobin 12.6 11.1 - 15.9 g/dL   Hematocrit 38.3 34.0 - 46.6 %   MCV 99 (H) 79 - 97 fL   MCH 32.6 26.6 - 33.0 pg   MCHC 32.9 31.5 - 35.7 g/dL   RDW 13.2 12.3 - 15.4 %   Platelets 201 150 - 450 x10E3/uL   Neutrophils 65 Not Estab. %   Lymphs 25 Not Estab. %   Monocytes 7 Not Estab. %   Eos 3 Not Estab. %   Basos 0 Not Estab. %   Neutrophils Absolute 3.6 1.4 - 7.0 x10E3/uL   Lymphocytes Absolute 1.4 0.7 - 3.1 x10E3/uL  Monocytes Absolute 0.4 0.1 - 0.9 x10E3/uL   EOS (ABSOLUTE) 0.1 0.0 - 0.4 x10E3/uL   Basophils Absolute 0.0 0.0 - 0.2 x10E3/uL   Immature Granulocytes 0 Not Estab. %   Immature Grans (Abs) 0.0 0.0 - 0.1 x10E3/uL  Comprehensive metabolic panel  Result Value Ref Range   Glucose 92 65 - 99 mg/dL   BUN 12 6 - 24 mg/dL   Creatinine, Ser 0.93 0.57 - 1.00 mg/dL   GFR calc non Af Amer 69 >59 mL/min/1.73   GFR calc Af Amer 80 >59 mL/min/1.73   BUN/Creatinine Ratio 13 9 - 23   Sodium 142 134 - 144 mmol/L   Potassium 4.8 3.5 - 5.2 mmol/L   Chloride 105 96 - 106 mmol/L   CO2 23 20 - 29 mmol/L   Calcium 9.5 8.7 - 10.2 mg/dL   Total Protein 6.8 6.0 - 8.5 g/dL   Albumin 4.5 3.5 - 5.5 g/dL   Globulin, Total 2.3 1.5 - 4.5 g/dL   Albumin/Globulin Ratio 2.0 1.2 - 2.2   Bilirubin Total 0.5 0.0 - 1.2 mg/dL   Alkaline Phosphatase 47 39 - 117 IU/L   AST 26 0 - 40 IU/L   ALT 22 0 - 32 IU/L  Lipid panel  Result Value Ref Range   Cholesterol, Total 219 (H) 100 - 199 mg/dL   Triglycerides 97 0 - 149  mg/dL   HDL 62 >39 mg/dL   VLDL Cholesterol Cal 19 5 - 40 mg/dL   LDL Calculated 138 (H) 0 - 99 mg/dL   Chol/HDL Ratio 3.5 0.0 - 4.4 ratio  TSH  Result Value Ref Range   TSH 2.340 0.450 - 4.500 uIU/mL  Urinalysis, Routine w reflex microscopic  Result Value Ref Range   Specific Gravity, UA 1.010 1.005 - 1.030   pH, UA 7.0 5.0 - 7.5   Color, UA Yellow Yellow   Appearance Ur Clear Clear   Leukocytes, UA Negative Negative   Protein, UA Negative Negative/Trace   Glucose, UA Negative Negative   Ketones, UA Negative Negative   RBC, UA Negative Negative   Bilirubin, UA Negative Negative   Urobilinogen, Ur 0.2 0.2 - 1.0 mg/dL   Nitrite, UA Negative Negative  HIV antibody (with reflex)  Result Value Ref Range   HIV Screen 4th Generation wRfx Non Reactive Non Reactive  Hepatitis C Antibody  Result Value Ref Range   Hep C Virus Ab <0.1 0.0 - 0.9 s/co ratio      Assessment & Plan:   Problem List Items Addressed This Visit      Other   Depression    The current medical regimen is effective;  continue present plan and medications.       Anxiety    The current medical regimen is effective;  continue present plan and medications.       Menopause    Discussed ongoing therapy which patient will discuss further with her GYN.          Follow up plan: Return in about 6 months (around 05/06/2019) for Physical Exam.

## 2018-11-05 NOTE — Assessment & Plan Note (Signed)
Discussed ongoing therapy which patient will discuss further with her GYN.

## 2018-11-05 NOTE — Assessment & Plan Note (Signed)
The current medical regimen is effective;  continue present plan and medications.  

## 2018-11-13 ENCOUNTER — Other Ambulatory Visit: Payer: Self-pay | Admitting: Obstetrics & Gynecology

## 2018-11-13 DIAGNOSIS — Z1231 Encounter for screening mammogram for malignant neoplasm of breast: Secondary | ICD-10-CM

## 2018-11-13 DIAGNOSIS — Z01419 Encounter for gynecological examination (general) (routine) without abnormal findings: Secondary | ICD-10-CM | POA: Diagnosis not present

## 2018-11-13 DIAGNOSIS — Z1239 Encounter for other screening for malignant neoplasm of breast: Secondary | ICD-10-CM | POA: Diagnosis not present

## 2018-11-13 LAB — HM PAP SMEAR: HM Pap smear: NEGATIVE

## 2018-12-04 ENCOUNTER — Ambulatory Visit
Admission: RE | Admit: 2018-12-04 | Discharge: 2018-12-04 | Disposition: A | Discharge status: Home / Self Care | Payer: 59 | Source: Ambulatory Visit | Attending: Obstetrics & Gynecology | Admitting: Obstetrics & Gynecology

## 2018-12-04 ENCOUNTER — Other Ambulatory Visit: Payer: Self-pay

## 2018-12-04 DIAGNOSIS — Z1231 Encounter for screening mammogram for malignant neoplasm of breast: Secondary | ICD-10-CM | POA: Diagnosis not present

## 2019-01-22 ENCOUNTER — Telehealth: Payer: 59 | Admitting: Physician Assistant

## 2019-01-22 DIAGNOSIS — R21 Rash and other nonspecific skin eruption: Secondary | ICD-10-CM

## 2019-01-22 MED ORDER — PREDNISONE 10 MG PO TABS: ORAL_TABLET | ORAL | 0 refills | Status: DC

## 2019-01-22 NOTE — Progress Notes (Signed)

## 2019-05-06 ENCOUNTER — Ambulatory Visit: Payer: 59 | Admitting: Family Medicine

## 2019-05-28 ENCOUNTER — Ambulatory Visit: Payer: 59 | Admitting: Family Medicine

## 2019-06-04 ENCOUNTER — Ambulatory Visit: Payer: 59 | Admitting: Family Medicine

## 2019-06-09 ENCOUNTER — Encounter: Payer: Self-pay | Admitting: Family Medicine

## 2019-06-09 ENCOUNTER — Ambulatory Visit (INDEPENDENT_AMBULATORY_CARE_PROVIDER_SITE_OTHER): Payer: 59 | Admitting: Family Medicine

## 2019-06-09 ENCOUNTER — Other Ambulatory Visit: Payer: Self-pay

## 2019-06-09 DIAGNOSIS — F419 Anxiety disorder, unspecified: Secondary | ICD-10-CM

## 2019-06-09 DIAGNOSIS — F325 Major depressive disorder, single episode, in full remission: Secondary | ICD-10-CM

## 2019-06-09 DIAGNOSIS — Z780 Asymptomatic menopausal state: Secondary | ICD-10-CM

## 2019-06-09 DIAGNOSIS — G43909 Migraine, unspecified, not intractable, without status migrainosus: Secondary | ICD-10-CM

## 2019-06-09 DIAGNOSIS — Z0000 Encounter for general adult medical examination without abnormal findings: Secondary | ICD-10-CM | POA: Diagnosis not present

## 2019-06-09 MED ORDER — SERTRALINE HCL 50 MG PO TABS: 50.0000 mg | ORAL_TABLET | Freq: Every morning | ORAL | 4 refills | Status: DC

## 2019-06-09 NOTE — Assessment & Plan Note (Signed)
The current medical regimen is effective;  continue present plan and medications.  

## 2019-06-09 NOTE — Progress Notes (Addendum)
There were no vitals taken for this visit.   Subjective:    Patient ID: Sierra Curtis, female    DOB: 06/11/1961, 58 y.o.   MRN: [id number]  HPI: Sierra Curtis is a 58 y.o. female  Med check Discussed with patient great deal of stress taking care of an aunt with dementia and worsening problems.  There is also a great deal of financial stress involved with his aunt. Discussed dementia care and treatment various resources. Discussed patient's migraine headaches been taking medication about once a week for migraines which have been controlling it well. Depression stable. Has not been using Valium still has prescription left discussed that the bottle still good. We will see GYN this winter.  Relevant past medical, surgical, family and social history reviewed and updated as indicated. Interim medical history since our last visit reviewed. Allergies and medications reviewed and updated.  Review of Systems  Constitutional: Negative.   HENT: Negative.   Eyes: Negative.   Respiratory: Negative.   Cardiovascular: Negative.   Gastrointestinal: Negative.   Endocrine: Negative.   Genitourinary: Negative.   Musculoskeletal: Negative.   Skin: Negative.   Allergic/Immunologic: Negative.   Neurological: Negative.   Hematological: Negative.   Psychiatric/Behavioral: Negative.     Per HPI unless specifically indicated above     Objective:    There were no vitals taken for this visit.  Wt Readings from Last 3 Encounters:  11/05/18 131 lb (59.4 kg)  10/28/18 131 lb 9.6 oz (59.7 kg)  04/30/18 130 lb (59 kg)    Physical Exam  Results for orders placed or performed in visit on 04/30/18  CBC with Differential/Platelet  Result Value Ref Range   WBC 5.5 3.4 - 10.8 x10E3/uL   RBC 3.87 3.77 - 5.28 x10E6/uL   Hemoglobin 12.6 11.1 - 15.9 g/dL   Hematocrit 38.3 34.0 - 46.6 %   MCV 99 (H) 79 - 97 fL   MCH 32.6 26.6 - 33.0 pg   MCHC 32.9 31.5 - 35.7 g/dL   RDW 13.2 12.3 - 15.4 %   Platelets 201 150 - 450 x10E3/uL   Neutrophils 65 Not Estab. %   Lymphs 25 Not Estab. %   Monocytes 7 Not Estab. %   Eos 3 Not Estab. %   Basos 0 Not Estab. %   Neutrophils Absolute 3.6 1.4 - 7.0 x10E3/uL   Lymphocytes Absolute 1.4 0.7 - 3.1 x10E3/uL   Monocytes Absolute 0.4 0.1 - 0.9 x10E3/uL   EOS (ABSOLUTE) 0.1 0.0 - 0.4 x10E3/uL   Basophils Absolute 0.0 0.0 - 0.2 x10E3/uL   Immature Granulocytes 0 Not Estab. %   Immature Grans (Abs) 0.0 0.0 - 0.1 x10E3/uL  Comprehensive metabolic panel  Result Value Ref Range   Glucose 92 65 - 99 mg/dL   BUN 12 6 - 24 mg/dL   Creatinine, Ser 0.93 0.57 - 1.00 mg/dL   GFR calc non Af Amer 69 >59 mL/min/1.73   GFR calc Af Amer 80 >59 mL/min/1.73   BUN/Creatinine Ratio 13 9 - 23   Sodium 142 134 - 144 mmol/L   Potassium 4.8 3.5 - 5.2 mmol/L   Chloride 105 96 - 106 mmol/L   CO2 23 20 - 29 mmol/L   Calcium 9.5 8.7 - 10.2 mg/dL   Total Protein 6.8 6.0 - 8.5 g/dL   Albumin 4.5 3.5 - 5.5 g/dL   Globulin, Total 2.3 1.5 - 4.5 g/dL   Albumin/Globulin Ratio 2.0 1.2 - 2.2   Bilirubin Total  0.5 0.0 - 1.2 mg/dL   Alkaline Phosphatase 47 39 - 117 IU/L   AST 26 0 - 40 IU/L   ALT 22 0 - 32 IU/L  Lipid panel  Result Value Ref Range   Cholesterol, Total 219 (H) 100 - 199 mg/dL   Triglycerides 97 0 - 149 mg/dL   HDL 62 >39 mg/dL   VLDL Cholesterol Cal 19 5 - 40 mg/dL   LDL Calculated 138 (H) 0 - 99 mg/dL   Chol/HDL Ratio 3.5 0.0 - 4.4 ratio  TSH  Result Value Ref Range   TSH 2.340 0.450 - 4.500 uIU/mL  Urinalysis, Routine w reflex microscopic  Result Value Ref Range   Specific Gravity, UA 1.010 1.005 - 1.030   pH, UA 7.0 5.0 - 7.5   Color, UA Yellow Yellow   Appearance Ur Clear Clear   Leukocytes, UA Negative Negative   Protein, UA Negative Negative/Trace   Glucose, UA Negative Negative   Ketones, UA Negative Negative   RBC, UA Negative Negative   Bilirubin, UA Negative Negative   Urobilinogen, Ur 0.2 0.2 - 1.0 mg/dL   Nitrite, UA Negative  Negative  HIV antibody (with reflex)  Result Value Ref Range   HIV Screen 4th Generation wRfx Non Reactive Non Reactive  Hepatitis C Antibody  Result Value Ref Range   Hep C Virus Ab <0.1 0.0 - 0.9 s/co ratio      Assessment & Plan:   Problem List Items Addressed This Visit      Cardiovascular and Mediastinum   Migraine    The current medical regimen is effective;  continue present plan and medications.       Relevant Medications   sertraline (ZOLOFT) 50 MG tablet     Other   Depression    The current medical regimen is effective;  continue present plan and medications.       Relevant Medications   sertraline (ZOLOFT) 50 MG tablet   Anxiety    The current medical regimen is effective;  continue present plan and medications.       Relevant Medications   sertraline (ZOLOFT) 50 MG tablet   Menopause    The current medical regimen is effective;  continue present plan and medications.        Other Visit Diagnoses    PE (physical exam), annual    -  Primary   Relevant Orders   Comprehensive metabolic panel   Lipid panel   CBC with Differential/Platelet   TSH   Urinalysis, Routine w reflex microscopic      Telemedicine using audio/video telecommunications for a synchronous communication visit. Today's visit due to COVID-19 isolation precautions I connected with and verified that I am speaking with the correct person using two identifiers.   I discussed the limitations, risks, security and privacy concerns of performing an evaluation and management service by telecommunication and the availability of in person appointments. I also discussed with the patient that there may be a patient responsible charge related to this service. The patient expressed understanding and agreed to proceed. The patient's location is home. I am at home.   I discussed the assessment and treatment plan with the patient. The patient was provided an opportunity to ask questions and all were  answered. The patient agreed with the plan and demonstrated an understanding of the instructions.   The patient was advised to call back or seek an in-person evaluation if the symptoms worsen or if the condition fails to improve  as anticipated.   I provided 21+ minutes of time during this encounter. Follow up plan: No follow-ups on file.

## 2019-07-28 ENCOUNTER — Ambulatory Visit (INDEPENDENT_AMBULATORY_CARE_PROVIDER_SITE_OTHER): Payer: 59

## 2019-07-28 ENCOUNTER — Ambulatory Visit: Payer: 59 | Admitting: Podiatry

## 2019-07-28 ENCOUNTER — Other Ambulatory Visit: Payer: Self-pay

## 2019-07-28 ENCOUNTER — Encounter: Payer: Self-pay | Admitting: Podiatry

## 2019-07-28 ENCOUNTER — Ambulatory Visit (INDEPENDENT_AMBULATORY_CARE_PROVIDER_SITE_OTHER): Payer: 59 | Admitting: Family Medicine

## 2019-07-28 ENCOUNTER — Other Ambulatory Visit: Payer: Self-pay | Admitting: Podiatry

## 2019-07-28 ENCOUNTER — Encounter: Payer: Self-pay | Admitting: Family Medicine

## 2019-07-28 VITALS — BP 132/75 | HR 69 | Resp 16

## 2019-07-28 VITALS — BP 119/60 | HR 65 | Ht 68.0 in | Wt 133.0 lb

## 2019-07-28 DIAGNOSIS — M79672 Pain in left foot: Secondary | ICD-10-CM | POA: Diagnosis not present

## 2019-07-28 DIAGNOSIS — M778 Other enthesopathies, not elsewhere classified: Secondary | ICD-10-CM

## 2019-07-28 DIAGNOSIS — M2141 Flat foot [pes planus] (acquired), right foot: Secondary | ICD-10-CM

## 2019-07-28 DIAGNOSIS — M76822 Posterior tibial tendinitis, left leg: Secondary | ICD-10-CM

## 2019-07-28 DIAGNOSIS — I10 Essential (primary) hypertension: Secondary | ICD-10-CM | POA: Diagnosis not present

## 2019-07-28 DIAGNOSIS — M2142 Flat foot [pes planus] (acquired), left foot: Secondary | ICD-10-CM

## 2019-07-28 MED ORDER — HYDROCHLOROTHIAZIDE 12.5 MG PO TABS: 12.5000 mg | ORAL_TABLET | Freq: Every day | ORAL | 0 refills | Status: DC

## 2019-07-28 NOTE — Assessment & Plan Note (Signed)
Mildly elevated more often than not per home BP checks, likely due to increased stressors over past year. Will work on [employer], exercise, stress reduction, and add low dose HCTZ back in. Monitor closely at home and f/u in 1 month. Call if getting low readings

## 2019-07-28 NOTE — Progress Notes (Signed)
BP 119/60   Pulse 65   Ht 5\' 8"  (1.727 m)   Wt 133 lb (60.3 kg)   BMI 20.22 kg/m    Subjective:    Patient ID: Sierra Curtis, female    DOB: 05-Dec-1960, 58 y.o.   MRN: [id number]  HPI: Sierra Curtis is a 58 y.o. female  Chief Complaint  Patient presents with  . Hypertension    . This visit was completed via WebEx due to the restrictions of the COVID-19 pandemic. All issues as above were discussed and addressed. Physical exam was done as above through visual confirmation on WebEx. If it was felt that the patient should be evaluated in the office, they were directed there. The patient verbally consented to this visit. . Location of the patient: home . Location of the provider: home . Those involved with this call:  . Provider: Merrie Roof, PA-C . CMA: Lesle Chris, [place] . Front Desk/Registration: Jill Side  . Time spent on call: 15 minutes with patient face to face via video conference. More than 50% of this time was spent in counseling and coordination of care. 5 minutes total spent in review of patient's record and preparation of their chart. I verified patient identity using two factors (patient name and date of birth). Patient consents verbally to being seen via telemedicine visit today.   Patient presenting today with some concerns about her blood pressures over the past few months. States she was on HCTZ for years in the past for HTN but was able to control things the past few years with diet and exercise and stress reduction off medication. Lately her stress has been high and she has not been able to be as active. BPs creeping up into the 160s/80s range at times, typically 130s-150s/80s range when checked at home. Denies CP, SOB, HAs, dizziness, leg swelling.   Relevant past medical, surgical, family and social history reviewed and updated as indicated. Interim medical history since our last visit reviewed. Allergies and medications reviewed and updated.  Review of  Systems  Per HPI unless specifically indicated above     Objective:    BP 119/60   Pulse 65   Ht 5\' 8"  (1.727 m)   Wt 133 lb (60.3 kg)   BMI 20.22 kg/m   Wt Readings from Last 3 Encounters:  07/28/19 133 lb (60.3 kg)  11/05/18 131 lb (59.4 kg)  10/28/18 131 lb 9.6 oz (59.7 kg)    Physical Exam Vitals signs and nursing note reviewed.  Constitutional:      General: She is not in acute distress.    Appearance: Normal appearance.  HENT:     Head: Atraumatic.     Right Ear: External ear normal.     Left Ear: External ear normal.     Nose: Nose normal. No congestion.     Mouth/Throat:     Mouth: Mucous membranes are moist.     Pharynx: Oropharynx is clear. No posterior oropharyngeal erythema.  Eyes:     Extraocular Movements: Extraocular movements intact.     Conjunctiva/sclera: Conjunctivae normal.  Neck:     Musculoskeletal: Normal range of motion.  Cardiovascular:     Comments: Unable to assess via virtual visit Pulmonary:     Effort: Pulmonary effort is normal. No respiratory distress.  Musculoskeletal: Normal range of motion.  Skin:    General: Skin is dry.     Findings: No erythema.  Neurological:     Mental Status: She is  alert and oriented to person, place, and time.  Psychiatric:        Mood and Affect: Mood normal.        Thought Content: Thought content normal.        Judgment: Judgment normal.     Results for orders placed or performed in visit on 04/30/18  CBC with Differential/Platelet  Result Value Ref Range   WBC 5.5 3.4 - 10.8 x10E3/uL   RBC 3.87 3.77 - 5.28 x10E6/uL   Hemoglobin 12.6 11.1 - 15.9 g/dL   Hematocrit 38.3 34.0 - 46.6 %   MCV 99 (H) 79 - 97 fL   MCH 32.6 26.6 - 33.0 pg   MCHC 32.9 31.5 - 35.7 g/dL   RDW 13.2 12.3 - 15.4 %   Platelets 201 150 - 450 x10E3/uL   Neutrophils 65 Not Estab. %   Lymphs 25 Not Estab. %   Monocytes 7 Not Estab. %   Eos 3 Not Estab. %   Basos 0 Not Estab. %   Neutrophils Absolute 3.6 1.4 - 7.0  x10E3/uL   Lymphocytes Absolute 1.4 0.7 - 3.1 x10E3/uL   Monocytes Absolute 0.4 0.1 - 0.9 x10E3/uL   EOS (ABSOLUTE) 0.1 0.0 - 0.4 x10E3/uL   Basophils Absolute 0.0 0.0 - 0.2 x10E3/uL   Immature Granulocytes 0 Not Estab. %   Immature Grans (Abs) 0.0 0.0 - 0.1 x10E3/uL  Comprehensive metabolic panel  Result Value Ref Range   Glucose 92 65 - 99 mg/dL   BUN 12 6 - 24 mg/dL   Creatinine, Ser 0.93 0.57 - 1.00 mg/dL   GFR calc non Af Amer 69 >59 mL/min/1.73   GFR calc Af Amer 80 >59 mL/min/1.73   BUN/Creatinine Ratio 13 9 - 23   Sodium 142 134 - 144 mmol/L   Potassium 4.8 3.5 - 5.2 mmol/L   Chloride 105 96 - 106 mmol/L   CO2 23 20 - 29 mmol/L   Calcium 9.5 8.7 - 10.2 mg/dL   Total Protein 6.8 6.0 - 8.5 g/dL   Albumin 4.5 3.5 - 5.5 g/dL   Globulin, Total 2.3 1.5 - 4.5 g/dL   Albumin/Globulin Ratio 2.0 1.2 - 2.2   Bilirubin Total 0.5 0.0 - 1.2 mg/dL   Alkaline Phosphatase 47 39 - 117 IU/L   AST 26 0 - 40 IU/L   ALT 22 0 - 32 IU/L  Lipid panel  Result Value Ref Range   Cholesterol, Total 219 (H) 100 - 199 mg/dL   Triglycerides 97 0 - 149 mg/dL   HDL 62 >39 mg/dL   VLDL Cholesterol Cal 19 5 - 40 mg/dL   LDL Calculated 138 (H) 0 - 99 mg/dL   Chol/HDL Ratio 3.5 0.0 - 4.4 ratio  TSH  Result Value Ref Range   TSH 2.340 0.450 - 4.500 uIU/mL  Urinalysis, Routine w reflex microscopic  Result Value Ref Range   Specific Gravity, UA 1.010 1.005 - 1.030   pH, UA 7.0 5.0 - 7.5   Color, UA Yellow Yellow   Appearance Ur Clear Clear   Leukocytes, UA Negative Negative   Protein, UA Negative Negative/Trace   Glucose, UA Negative Negative   Ketones, UA Negative Negative   RBC, UA Negative Negative   Bilirubin, UA Negative Negative   Urobilinogen, Ur 0.2 0.2 - 1.0 mg/dL   Nitrite, UA Negative Negative  HIV antibody (with reflex)  Result Value Ref Range   HIV Screen 4th Generation wRfx Non Reactive Non Reactive  Hepatitis C Antibody  Result Value Ref Range   Hep C Virus Ab <0.1 0.0 - 0.9  s/co ratio      Assessment & Plan:   Problem List Items Addressed This Visit      Cardiovascular and Mediastinum   Hypertension - Primary    Mildly elevated more often than not per home BP checks, likely due to increased stressors over past year. Will work on [employer], exercise, stress reduction, and add low dose HCTZ back in. Monitor closely at home and f/u in 1 month. Call if getting low readings      Relevant Medications   hydrochlorothiazide (HYDRODIURIL) 12.5 MG tablet       Follow up plan: Return in about 4 weeks (around 08/25/2019) for CPE, BP f/u.

## 2019-07-29 ENCOUNTER — Encounter: Payer: Self-pay | Admitting: Podiatry

## 2019-07-29 NOTE — Progress Notes (Signed)
Subjective:  Patient ID: Sierra Curtis, female    DOB: [birth year],  MRN: [id number]  Chief Complaint  Patient presents with  . Foot Pain    Medial foot left - aching x 1 month, was training for a race (active in running) and noticed increase in pain, has felt while aleeping too, tried to cut back on running/walking, Ibuprofen and ice  . New Patient (Initial Visit)    58 y.o. female presents with the above complaint.  Patient states that she has had this medial foot pain for a month now.  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  She states that she wants to get back to running however she is happy with walking at a fast pace if she is unable to run anymore.  She also states that she has a friend that works for [employer].  She denies any other acute complaints.  She has not tried any alleviating treatments.  She has not seen any other podiatrist.   Review of Systems: Negative except as noted in the HPI. Denies N/V/F/Ch.  Past Medical History:  Diagnosis Date  . Depression   . Hypertension     Current Outpatient Medications:  .  B Complex-C (SUPER B COMPLEX PO), Take by mouth daily., Disp: , Rfl:  .  cetirizine (ZYRTEC) 10 MG tablet, Take 10 mg by mouth daily., Disp: , Rfl:  .  Cholecalciferol (VITAMIN D-3) 1000 UNITS CAPS, Take 1,000 Units by mouth daily., Disp: , Rfl:  .  co-enzyme Q-10 30 MG capsule, Take 30 mg by mouth daily., Disp: , Rfl:  .  [employer] BIOTIN PO, Take by mouth., Disp: , Rfl:  .  diazepam (VALIUM) 5 MG tablet, Take 1 tablet (5 mg total) by mouth every 12 (twelve) hours as needed for anxiety. Muscle cramps, Disp: 30 tablet, Rfl: 0 .  estradiol-norethindrone (ACTIVELLA) 1-0.5 MG tablet, Take by mouth daily., Disp: , Rfl:  .  hydrochlorothiazide (HYDRODIURIL) 12.5 MG tablet, Take 1 tablet (12.5 mg total) by mouth daily., Disp: 30 tablet, Rfl: 0 .  rizatriptan (MAXALT) 5 MG tablet, TAKE 1 TABLET BY MOUTH ONCE AS NEEDED FOR MIGRAINE FOR  UP  TO ONE DOSE. MAY TAKE A  SECOND DOSE AFTER 2 HOURS  IF NEEDED., Disp: , Rfl:  .  sertraline (ZOLOFT) 50 MG tablet, Take 1 tablet (50 mg total) by mouth every morning., Disp: 90 tablet, Rfl: 4  Social History   Tobacco Use  Smoking Status Never Smoker  Smokeless Tobacco Never Used    No Known Allergies Objective:   Vitals:   07/28/19 1020  BP: 132/75  Pulse: 69  Resp: 16   There is no height or weight on file to calculate BMI. Constitutional Well developed. Well nourished.  Vascular Dorsalis pedis pulses palpable bilaterally. Posterior tibial pulses palpable bilaterally. Capillary refill normal to all digits.  No cyanosis or clubbing noted. Pedal hair growth normal.  Neurologic Normal speech. Oriented to person, place, and time. Epicritic sensation to light touch grossly present bilaterally.  Dermatologic Nails well groomed and normal in appearance. No open wounds. No skin lesions.  Orthopedic:  Pain on palpation along the medial aspect of the first metatarsal.  Pain at the insertion of posterior tibial tendon.  Pain with inversion of the foot actively and passively.  No pain on eversion dorsiflexion or plantarflexion.  Negative Tinel's sign negative Achilles tendinitis negative ATFL.  Gait examination showed flexible pes planus deformity reduction of the  arch when weightbearing.  Recreation of the arch noted upon performing rupture maneuver.  Negative for too many toe sign.  Double heel raise negative.   Radiographs: 3 views of skeletally mature adult: Posterior and plantar heel spur noted.  There is a slight decrease in calcaneal inclination angle increase in talar declination.  Mearys angle is unchanged.  No arthritic changes noted no other bony deformities noted. Assessment:   1. Posterior tibial tendinitis of left lower extremity    Plan:  Patient was evaluated and treated and all questions answered.  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  I recommended that patient will benefit from a Tri-Lock ankle brace to help support the ankle and medial range of motion. -Patient also states that he has a friend who works for an [employer] and she is able to obtain orthotics through her for now.  If she is unable to do so she will complete the orthotics from os.  I educated the patient the importance of orthotics to help control the motion of the foot and thereby preventing and supporting pes planus deformity. -I encouraged her for now walking is the best as opposed to running.  Once she is healed up I will be more less restrictive on her movements. -If the pain is unresolved will plan on obtaining in a left foot and ankle MRI.  Return in about 6 weeks (around 09/08/2019) for Tendonitis.

## 2019-07-31 ENCOUNTER — Encounter: Payer: Self-pay | Admitting: Podiatry

## 2019-08-25 ENCOUNTER — Encounter: Payer: Self-pay | Admitting: Family Medicine

## 2019-08-25 ENCOUNTER — Ambulatory Visit (INDEPENDENT_AMBULATORY_CARE_PROVIDER_SITE_OTHER): Payer: 59 | Admitting: Family Medicine

## 2019-08-25 ENCOUNTER — Other Ambulatory Visit: Payer: Self-pay

## 2019-08-25 VITALS — BP 136/71 | HR 63 | Ht 68.0 in | Wt 133.0 lb

## 2019-08-25 DIAGNOSIS — I10 Essential (primary) hypertension: Secondary | ICD-10-CM

## 2019-08-25 MED ORDER — HYDROCHLOROTHIAZIDE 12.5 MG PO TABS: 12.5000 mg | ORAL_TABLET | Freq: Every day | ORAL | 1 refills | Status: DC

## 2019-08-25 NOTE — Progress Notes (Signed)
BP 136/71   Pulse 63   Ht 5\' 8"  (1.727 m)   Wt 133 lb (60.3 kg)   BMI 20.22 kg/m    Subjective:    Patient ID: Sierra Curtis, female    DOB: 1961-08-09, 58 y.o.   MRN: [id number]  HPI: Sierra Curtis is a 58 y.o. female  Chief Complaint  Patient presents with  . Hypertension    . This visit was completed via WebEx due to the restrictions of the COVID-19 pandemic. All issues as above were discussed and addressed. Physical exam was done as above through visual confirmation on WebEx. If it was felt that the patient should be evaluated in the office, they were directed there. The patient verbally consented to this visit. . Location of the patient: home . Location of the provider: home . Those involved with this call:  . Provider: Merrie Roof, PA-C . CMA: Lesle Chris, [place] . Front Desk/Registration: Jill Side  . Time spent on call: 15 minutes with patient face to face via video conference. More than 50% of this time was spent in counseling and coordination of care. 5 minutes total spent in review of patient's record and preparation of their chart. I verified patient identity using two factors (patient name and date of birth). Patient consents verbally to being seen via telemedicine visit today.   Here today for 1 month HTN f/u after restarting low dose HCTZ. Tolerating well, taking faithfully. Home BP readings 120s-140s/60s-70s. Denies CP, SOB, HAs, dizziness. Trying to manage stress better and stay more active. Eats a healthy, low sodium diet.   Relevant past medical, surgical, family and social history reviewed and updated as indicated. Interim medical history since our last visit reviewed. Allergies and medications reviewed and updated.  Review of Systems  Per HPI unless specifically indicated above     Objective:    BP 136/71   Pulse 63   Ht 5\' 8"  (1.727 m)   Wt 133 lb (60.3 kg)   BMI 20.22 kg/m   Wt Readings from Last 3 Encounters:  08/25/19 133 lb (60.3 kg)   07/28/19 133 lb (60.3 kg)  11/05/18 131 lb (59.4 kg)    Physical Exam Vitals signs and nursing note reviewed.  Constitutional:      General: She is not in acute distress.    Appearance: Normal appearance.  HENT:     Head: Atraumatic.     Right Ear: External ear normal.     Left Ear: External ear normal.     Nose: Nose normal. No congestion.     Mouth/Throat:     Mouth: Mucous membranes are moist.     Pharynx: Oropharynx is clear. No posterior oropharyngeal erythema.  Eyes:     Extraocular Movements: Extraocular movements intact.     Conjunctiva/sclera: Conjunctivae normal.  Neck:     Musculoskeletal: Normal range of motion.  Cardiovascular:     Comments: Unable to assess via virtual visit Pulmonary:     Effort: Pulmonary effort is normal. No respiratory distress.  Musculoskeletal: Normal range of motion.  Skin:    General: Skin is dry.     Findings: No erythema.  Neurological:     Mental Status: She is alert and oriented to person, place, and time.  Psychiatric:        Mood and Affect: Mood normal.        Thought Content: Thought content normal.        Judgment: Judgment normal.  Results for orders placed or performed in visit on 04/30/18  CBC with Differential/Platelet  Result Value Ref Range   WBC 5.5 3.4 - 10.8 x10E3/uL   RBC 3.87 3.77 - 5.28 x10E6/uL   Hemoglobin 12.6 11.1 - 15.9 g/dL   Hematocrit 38.3 34.0 - 46.6 %   MCV 99 (H) 79 - 97 fL   MCH 32.6 26.6 - 33.0 pg   MCHC 32.9 31.5 - 35.7 g/dL   RDW 13.2 12.3 - 15.4 %   Platelets 201 150 - 450 x10E3/uL   Neutrophils 65 Not Estab. %   Lymphs 25 Not Estab. %   Monocytes 7 Not Estab. %   Eos 3 Not Estab. %   Basos 0 Not Estab. %   Neutrophils Absolute 3.6 1.4 - 7.0 x10E3/uL   Lymphocytes Absolute 1.4 0.7 - 3.1 x10E3/uL   Monocytes Absolute 0.4 0.1 - 0.9 x10E3/uL   EOS (ABSOLUTE) 0.1 0.0 - 0.4 x10E3/uL   Basophils Absolute 0.0 0.0 - 0.2 x10E3/uL   Immature Granulocytes 0 Not Estab. %   Immature Grans  (Abs) 0.0 0.0 - 0.1 x10E3/uL  Comprehensive metabolic panel  Result Value Ref Range   Glucose 92 65 - 99 mg/dL   BUN 12 6 - 24 mg/dL   Creatinine, Ser 0.93 0.57 - 1.00 mg/dL   GFR calc non Af Amer 69 >59 mL/min/1.73   GFR calc Af Amer 80 >59 mL/min/1.73   BUN/Creatinine Ratio 13 9 - 23   Sodium 142 134 - 144 mmol/L   Potassium 4.8 3.5 - 5.2 mmol/L   Chloride 105 96 - 106 mmol/L   CO2 23 20 - 29 mmol/L   Calcium 9.5 8.7 - 10.2 mg/dL   Total Protein 6.8 6.0 - 8.5 g/dL   Albumin 4.5 3.5 - 5.5 g/dL   Globulin, Total 2.3 1.5 - 4.5 g/dL   Albumin/Globulin Ratio 2.0 1.2 - 2.2   Bilirubin Total 0.5 0.0 - 1.2 mg/dL   Alkaline Phosphatase 47 39 - 117 IU/L   AST 26 0 - 40 IU/L   ALT 22 0 - 32 IU/L  Lipid panel  Result Value Ref Range   Cholesterol, Total 219 (H) 100 - 199 mg/dL   Triglycerides 97 0 - 149 mg/dL   HDL 62 >39 mg/dL   VLDL Cholesterol Cal 19 5 - 40 mg/dL   LDL Calculated 138 (H) 0 - 99 mg/dL   Chol/HDL Ratio 3.5 0.0 - 4.4 ratio  TSH  Result Value Ref Range   TSH 2.340 0.450 - 4.500 uIU/mL  Urinalysis, Routine w reflex microscopic  Result Value Ref Range   Specific Gravity, UA 1.010 1.005 - 1.030   pH, UA 7.0 5.0 - 7.5   Color, UA Yellow Yellow   Appearance Ur Clear Clear   Leukocytes, UA Negative Negative   Protein, UA Negative Negative/Trace   Glucose, UA Negative Negative   Ketones, UA Negative Negative   RBC, UA Negative Negative   Bilirubin, UA Negative Negative   Urobilinogen, Ur 0.2 0.2 - 1.0 mg/dL   Nitrite, UA Negative Negative  HIV antibody (with reflex)  Result Value Ref Range   HIV Screen 4th Generation wRfx Non Reactive Non Reactive  Hepatitis C Antibody  Result Value Ref Range   Hep C Virus Ab <0.1 0.0 - 0.9 s/co ratio      Assessment & Plan:   Problem List Items Addressed This Visit      Cardiovascular and Mediastinum   Hypertension - Primary  BPs improved, stable and WNL. Continue current regimen. Check bmp      Relevant Medications    hydrochlorothiazide (HYDRODIURIL) 12.5 MG tablet   Other Relevant Orders   Basic metabolic panel       Follow up plan: Return in about 6 months (around 02/23/2020) for 6 month f/u.

## 2019-08-28 ENCOUNTER — Other Ambulatory Visit: Payer: Self-pay

## 2019-08-28 ENCOUNTER — Other Ambulatory Visit: Payer: 59

## 2019-08-28 DIAGNOSIS — I10 Essential (primary) hypertension: Secondary | ICD-10-CM

## 2019-08-28 NOTE — Assessment & Plan Note (Signed)
BPs improved, stable and WNL. Continue current regimen. Check bmp

## 2019-08-29 LAB — BASIC METABOLIC PANEL
BUN/Creatinine Ratio: 17 (ref 9–23)
BUN: 14 mg/dL (ref 6–24)
CO2: 25 mmol/L (ref 20–29)
Calcium: 9.5 mg/dL (ref 8.7–10.2)
Chloride: 100 mmol/L (ref 96–106)
Creatinine, Ser: 0.82 mg/dL (ref 0.57–1.00)
GFR calc Af Amer: 92 mL/min/{1.73_m2} (ref >59)
GFR calc non Af Amer: 80 mL/min/{1.73_m2} (ref >59)
Glucose: 73 mg/dL (ref 65–99)
Potassium: 3.8 mmol/L (ref 3.5–5.2)
Sodium: 140 mmol/L (ref 134–144)

## 2019-09-08 ENCOUNTER — Ambulatory Visit: Payer: 59 | Admitting: Podiatry

## 2019-10-21 ENCOUNTER — Other Ambulatory Visit: Payer: Self-pay | Admitting: Obstetrics & Gynecology

## 2019-10-21 DIAGNOSIS — Z1231 Encounter for screening mammogram for malignant neoplasm of breast: Secondary | ICD-10-CM

## 2019-10-29 ENCOUNTER — Ambulatory Visit: Payer: 59 | Attending: Internal Medicine

## 2019-10-29 DIAGNOSIS — Z20822 Contact with and (suspected) exposure to covid-19: Secondary | ICD-10-CM

## 2019-10-30 LAB — NOVEL CORONAVIRUS, NAA: SARS-CoV-2, NAA: NOT DETECTED

## 2019-11-17 ENCOUNTER — Ambulatory Visit: Payer: 59 | Attending: Internal Medicine

## 2019-11-17 DIAGNOSIS — Z20822 Contact with and (suspected) exposure to covid-19: Secondary | ICD-10-CM

## 2019-11-18 LAB — NOVEL CORONAVIRUS, NAA: SARS-CoV-2, NAA: NOT DETECTED

## 2019-12-07 ENCOUNTER — Ambulatory Visit
Admission: RE | Admit: 2019-12-07 | Discharge: 2019-12-07 | Disposition: A | Discharge status: Home / Self Care | Payer: 59 | Source: Ambulatory Visit | Attending: Obstetrics & Gynecology | Admitting: Obstetrics & Gynecology

## 2019-12-07 DIAGNOSIS — Z1231 Encounter for screening mammogram for malignant neoplasm of breast: Secondary | ICD-10-CM | POA: Diagnosis present

## 2020-01-31 ENCOUNTER — Other Ambulatory Visit: Payer: Self-pay | Admitting: Family Medicine

## 2020-01-31 NOTE — Telephone Encounter (Signed)
Requested Prescriptions  Pending Prescriptions Disp Refills  . hydrochlorothiazide (HYDRODIURIL) 12.5 MG tablet [Pharmacy Med Name: HYDROCHLOROTHIAZIDE TABS 12.5MG ] 90 tablet 0    Sig: TAKE 1 TABLET DAILY     Cardiovascular: Diuretics - Thiazide Passed - 01/31/2020  5:50 AM      Passed - Ca in normal range and within 360 days    Calcium  Date Value Ref Range Status  08/28/2019 9.5 8.7 - 10.2 mg/dL Final         Passed - Cr in normal range and within 360 days    Creatinine, Ser  Date Value Ref Range Status  08/28/2019 0.82 0.57 - 1.00 mg/dL Final         Passed - K in normal range and within 360 days    Potassium  Date Value Ref Range Status  08/28/2019 3.8 3.5 - 5.2 mmol/L Final         Passed - Na in normal range and within 360 days    Sodium  Date Value Ref Range Status  08/28/2019 140 134 - 144 mmol/L Final         Passed - Last BP in normal range    BP Readings from Last 1 Encounters:  08/25/19 136/71         Passed - Valid encounter within last 6 months    Recent Outpatient Visits          5 months ago Essential hypertension   [facility], [place]   6 months ago Essential hypertension   [facility], [place]   7 months ago PE (physical exam), annual   [place], Jeannette How, [place]   1 year ago Major depressive disorder with single episode, in full remission [facility])   [place], Jeannette How, [place]   1 year ago Acute maxillary sinusitis, recurrence not specified   [place], Lilia Argue, [place]      Future Appointments            In 2 months [facility]an[facility], Lilia Argue, [place], [facility]

## 2020-02-19 IMAGING — MG MM DIGITAL SCREENING BILAT W/ TOMO W/ CAD
9 of 12 series · 9 of 28 positions shown · non-contrast
Comparison: Previous exam(s).

CLINICAL DATA: Screening.

EXAM:
DIGITAL SCREENING BILATERAL MAMMOGRAM WITH TOMO AND CAD

[R CC synth-2D]
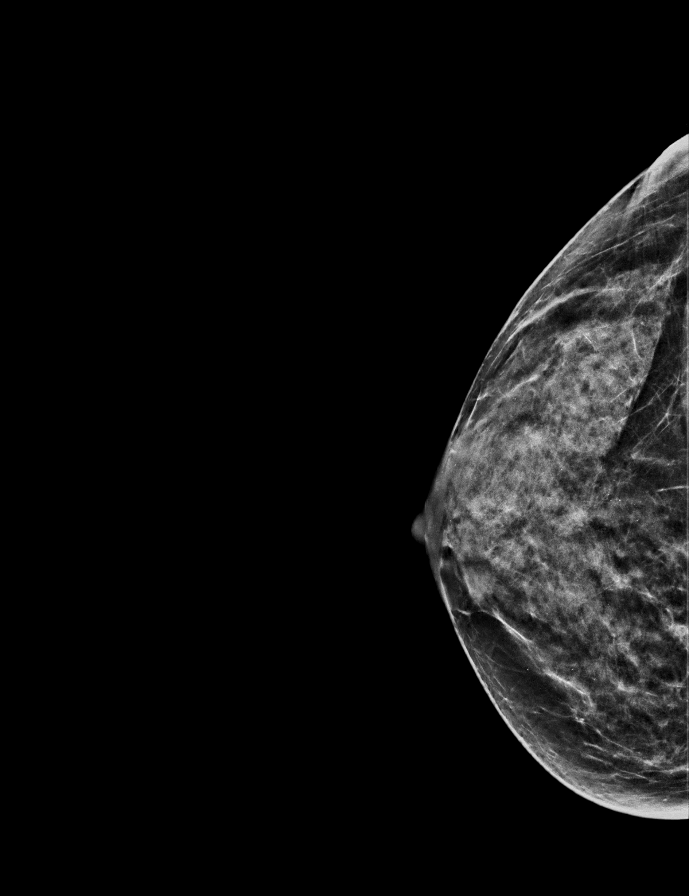

[L CC synth-2D]
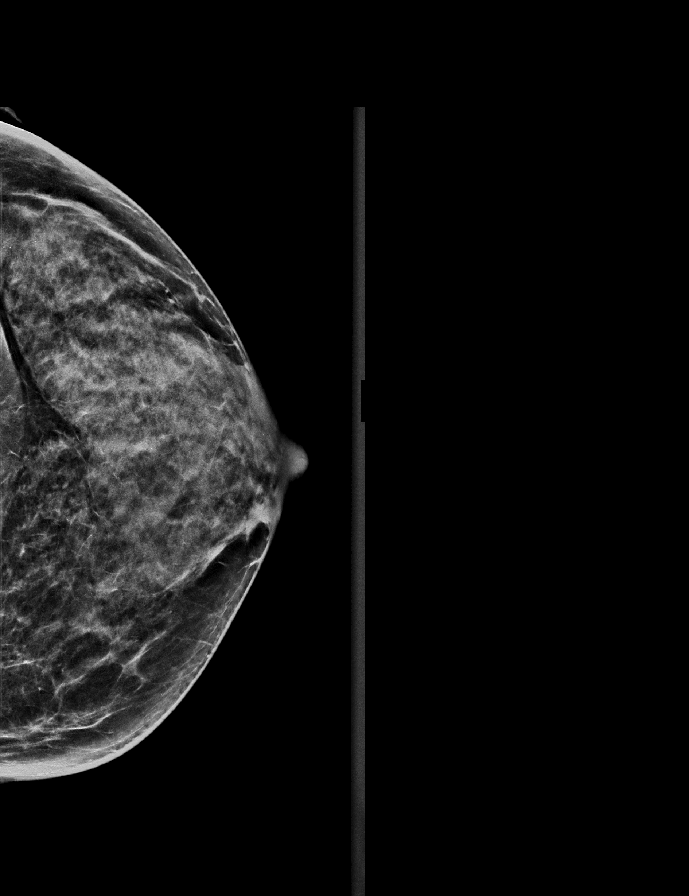

[R MLO]
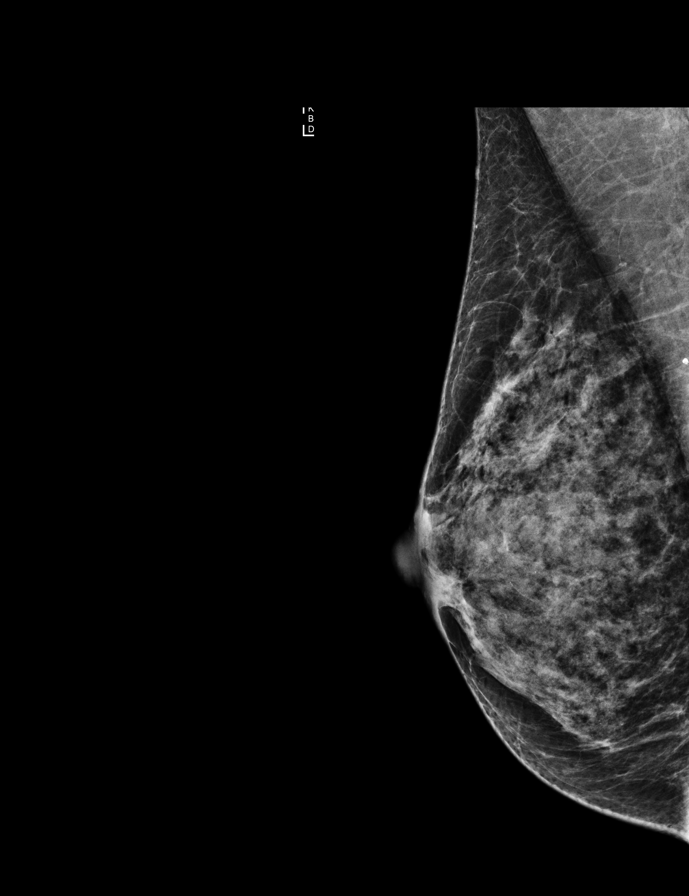

[L MLO synth-2D]
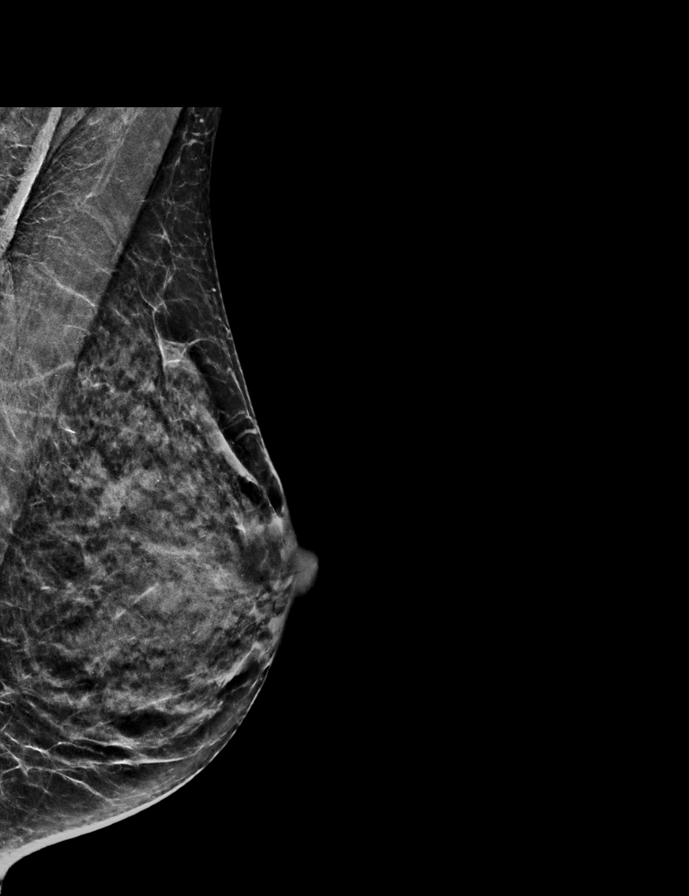

[L CC]
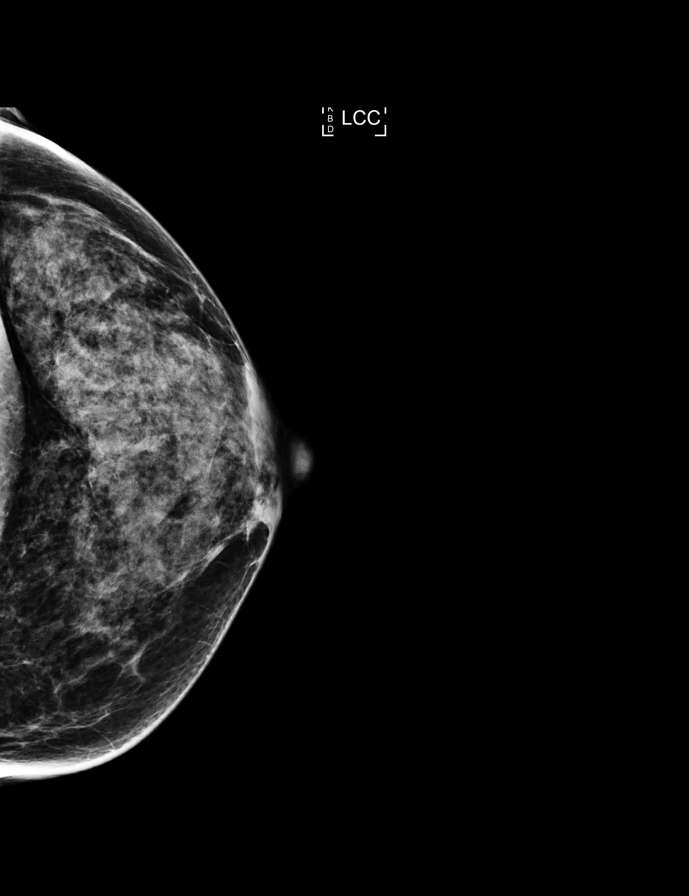

[R MLO synth-2D]
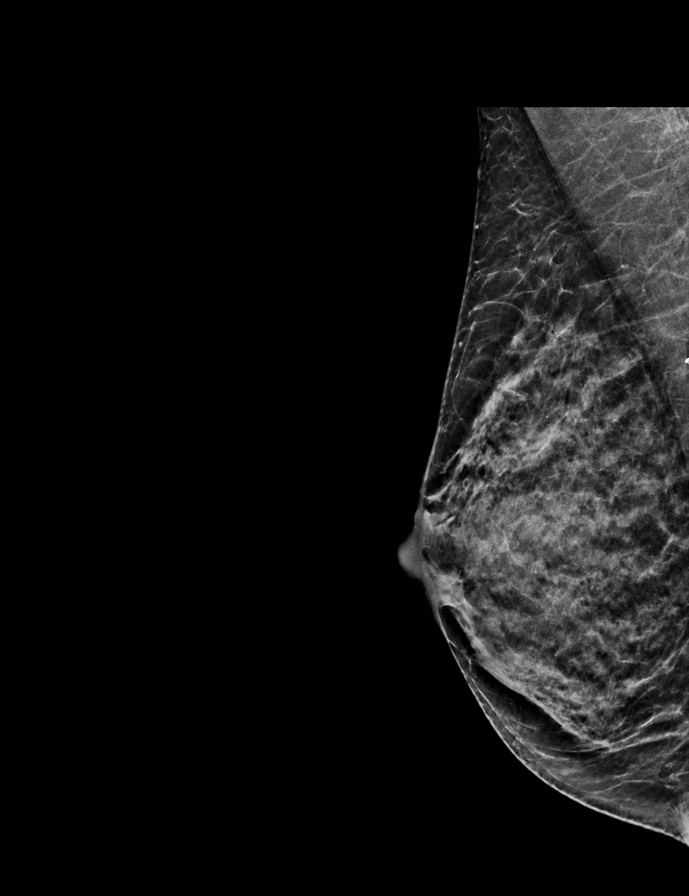

[R CC]
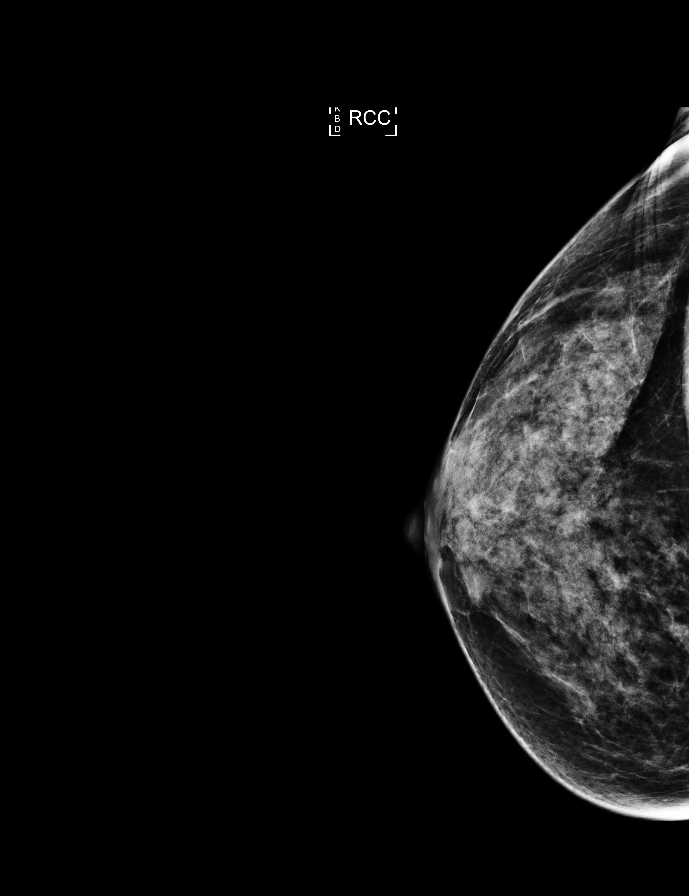

[L MLO]
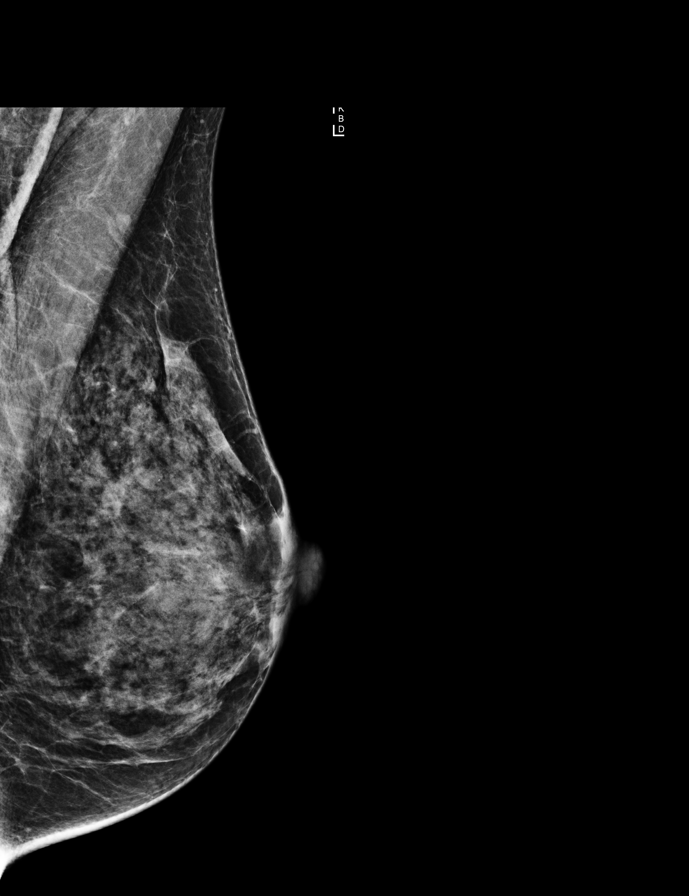

[L MLO tomo · tomo slice 25/49.0]
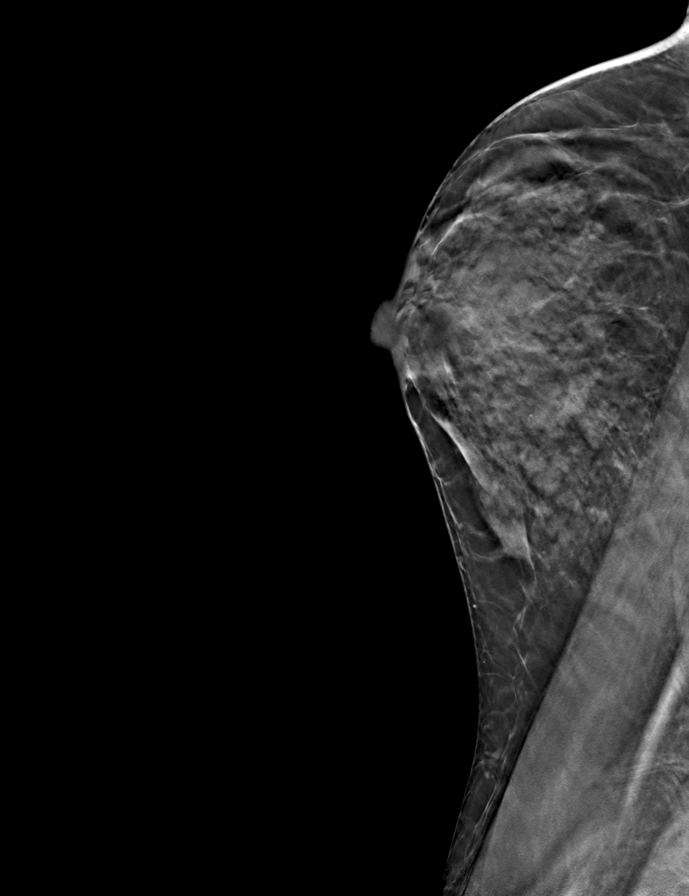

[9 of 28 positions shown; findings below may reference images not displayed]

ACR Breast Density Category d: The breast tissue is extremely dense,
which lowers the sensitivity of mammography.
FINDINGS: There are no findings suspicious for malignancy. Images were
processed with CAD.
IMPRESSION: No mammographic evidence of malignancy. A result letter of this
screening mammogram will be mailed directly to the patient.

RECOMMENDATION:
Screening mammogram in one year. (Code:RA-I-AVB)

BI-RADS CATEGORY  1: Negative.

## 2020-04-01 ENCOUNTER — Ambulatory Visit (INDEPENDENT_AMBULATORY_CARE_PROVIDER_SITE_OTHER): Payer: 59 | Admitting: Family Medicine

## 2020-04-01 ENCOUNTER — Other Ambulatory Visit: Payer: Self-pay

## 2020-04-01 ENCOUNTER — Encounter: Payer: Self-pay | Admitting: Family Medicine

## 2020-04-01 VITALS — BP 124/79 | HR 91 | Temp 98.4°F | Wt 134.0 lb

## 2020-04-01 DIAGNOSIS — G43909 Migraine, unspecified, not intractable, without status migrainosus: Secondary | ICD-10-CM

## 2020-04-01 DIAGNOSIS — F325 Major depressive disorder, single episode, in full remission: Secondary | ICD-10-CM | POA: Diagnosis not present

## 2020-04-01 DIAGNOSIS — I10 Essential (primary) hypertension: Secondary | ICD-10-CM

## 2020-04-01 MED ORDER — HYDROCHLOROTHIAZIDE 12.5 MG PO TABS: 12.5000 mg | ORAL_TABLET | Freq: Every day | ORAL | 1 refills | Status: DC

## 2020-04-01 MED ORDER — SERTRALINE HCL 50 MG PO TABS: 50.0000 mg | ORAL_TABLET | Freq: Every morning | ORAL | 1 refills | Status: DC

## 2020-04-01 NOTE — Assessment & Plan Note (Signed)
Stable, well controlled. Continue current regimen 

## 2020-04-01 NOTE — Progress Notes (Signed)
BP 124/79    Pulse 91    Temp 98.4 F (36.9 C) (Oral)    Wt 134 lb (60.8 kg)    SpO2 98%    BMI 20.37 kg/m    Subjective:    Patient ID: Sierra Curtis, female    DOB: 14-Jul-1961, 59 y.o.   MRN: [id number]  HPI: Sierra Curtis is a 59 y.o. female  Chief Complaint  Patient presents with   Hypertension   HTN - Taking HCTZ faithfully without side effects. Denies CP, SOB, HAs, Dizziness. Eating healthy diet and staying very active.   Depression and anxiety - on zoloft, feeling moods and anxiety are under excellent control. Denies SI/HI, mood swings, anhedonia.   Rarely having migraines, maybe one per month on average and maxalt working well to get rid of them quickly.   Depression screen [facility] 2/9 08/25/2019 04/30/2018 10/21/2017  Decreased Interest 0 0 0  Down, Depressed, Hopeless 0 0 0  PHQ - 2 Score 0 0 0  Altered sleeping 0 0 -  Tired, decreased energy 0 0 -  Change in appetite 1 0 -  Feeling bad or failure about yourself  0 0 -  Trouble concentrating 0 0 -  Moving slowly or fidgety/restless 0 0 -  Suicidal thoughts 0 0 -  PHQ-9 Score 1 0 -  Difficult doing work/chores Not difficult at all - -  No flowsheet data found.  Relevant past medical, surgical, family and social history reviewed and updated as indicated. Interim medical history since our last visit reviewed. Allergies and medications reviewed and updated.  Review of Systems  Per HPI unless specifically indicated above     Objective:    BP 124/79    Pulse 91    Temp 98.4 F (36.9 C) (Oral)    Wt 134 lb (60.8 kg)    SpO2 98%    BMI 20.37 kg/m   Wt Readings from Last 3 Encounters:  04/01/20 134 lb (60.8 kg)  08/25/19 133 lb (60.3 kg)  07/28/19 133 lb (60.3 kg)    Physical Exam Vitals and nursing note reviewed.  Constitutional:      Appearance: Normal appearance. She is not ill-appearing.  HENT:     Head: Atraumatic.  Eyes:     Extraocular Movements: Extraocular movements intact.     Conjunctiva/sclera:  Conjunctivae normal.  Cardiovascular:     Rate and Rhythm: Normal rate and regular rhythm.     Heart sounds: Normal heart sounds.  Pulmonary:     Effort: Pulmonary effort is normal.     Breath sounds: Normal breath sounds.  Musculoskeletal:        General: Normal range of motion.     Cervical back: Normal range of motion and neck supple.  Skin:    General: Skin is warm and dry.  Neurological:     Mental Status: She is alert and oriented to person, place, and time.  Psychiatric:        Mood and Affect: Mood normal.        Thought Content: Thought content normal.        Judgment: Judgment normal.     Results for orders placed or performed in visit on 11/17/19  Novel Coronavirus, NAA ([facility])   Specimen: Nasopharyngeal(NP) swabs in vial transport medium   NASOPHARYNGE  TESTING  Result Value Ref Range   SARS-CoV-2, NAA Not Detected Not Detected      Assessment & Plan:   Problem List Items Addressed  This Visit      Cardiovascular and Mediastinum   Hypertension - Primary    Stable and under good control, continue current regimen      Relevant Medications   hydrochlorothiazide (HYDRODIURIL) 12.5 MG tablet   Other Relevant Orders   Comprehensive metabolic panel   Migraine    Stable, well controlled. Continue current regimen      Relevant Medications   sertraline (ZOLOFT) 50 MG tablet   hydrochlorothiazide (HYDRODIURIL) 12.5 MG tablet     Other   Depression    Stable and under good control, continue current regimen      Relevant Medications   sertraline (ZOLOFT) 50 MG tablet       Follow up plan: Return in about 6 months (around 10/02/2020) for CPE.

## 2020-04-01 NOTE — Assessment & Plan Note (Signed)
Stable and under good control, continue current regimen 

## 2020-04-02 LAB — COMPREHENSIVE METABOLIC PANEL
ALT: 19 IU/L (ref 0–32)
AST: 20 IU/L (ref 0–40)
Albumin/Globulin Ratio: 1.9 (ref 1.2–2.2)
Albumin: 4.3 g/dL (ref 3.8–4.9)
Alkaline Phosphatase: 51 IU/L (ref 48–121)
BUN/Creatinine Ratio: 11 (ref 9–23)
BUN: 8 mg/dL (ref 6–24)
Bilirubin Total: 0.7 mg/dL (ref 0.0–1.2)
CO2: 25 mmol/L (ref 20–29)
Calcium: 9.3 mg/dL (ref 8.7–10.2)
Chloride: 103 mmol/L (ref 96–106)
Creatinine, Ser: 0.74 mg/dL (ref 0.57–1.00)
GFR calc Af Amer: 104 mL/min/{1.73_m2} (ref >59)
GFR calc non Af Amer: 90 mL/min/{1.73_m2} (ref >59)
Globulin, Total: 2.3 g/dL (ref 1.5–4.5)
Glucose: 86 mg/dL (ref 65–99)
Potassium: 4.2 mmol/L (ref 3.5–5.2)
Sodium: 141 mmol/L (ref 134–144)
Total Protein: 6.6 g/dL (ref 6.0–8.5)

## 2020-05-09 ENCOUNTER — Encounter: Payer: Self-pay | Admitting: Family Medicine

## 2020-09-05 ENCOUNTER — Other Ambulatory Visit: Payer: Self-pay | Admitting: Family Medicine

## 2020-09-05 DIAGNOSIS — F325 Major depressive disorder, single episode, in full remission: Secondary | ICD-10-CM

## 2020-10-06 ENCOUNTER — Encounter: Payer: 59 | Admitting: Family Medicine

## 2020-10-07 ENCOUNTER — Other Ambulatory Visit: Payer: Self-pay | Admitting: Family Medicine

## 2020-10-26 NOTE — Progress Notes (Signed)
BP 131/73   Pulse 67   Temp 98 F (36.7 C) (Oral)   Ht 5' 7"  (1.702 m)   Wt 136 lb 6.4 oz (61.9 kg)   SpO2 99%   BMI 21.36 kg/m    Subjective:    Patient ID: Sierra Curtis, female    DOB: 01/17/1961, 60 y.o.   MRN: [id number]  HPI: Sierra Curtis is a 60 y.o. female presenting on 10/27/2020 for comprehensive medical examination. Current medical complaints include:none  She currently lives with: Husband Menopausal Symptoms: Patient is on hormones with GYN.  HYPERTENSION Hypertension status: stable  Satisfied with current treatment? yes Duration of hypertension: years BP monitoring frequency:  Not regularly checking BP range:  BP medication side effects:  no Medication compliance: excellent compliance Previous BP meds:HCTZ Aspirin: no Recurrent headaches: no Visual changes: no Palpitations: no Dyspnea: no Chest pain: no Lower extremity edema: no Dizzy/lightheaded: no  DEPRESSION Mood status: stable Satisfied with current treatment?: yes Symptom severity: mild  Duration of current treatment : years Side effects: no Medication compliance: excellent compliance Psychotherapy/counseling: no  Previous psychiatric medications: zoloft Depressed mood: no Anxious mood: Occasionally. Anhedonia: no Significant weight loss or gain: no Insomnia: no  Fatigue: no Feelings of worthlessness or guilt: no Impaired concentration/indecisiveness: no Suicidal ideations: no Hopelessness: no Crying spells: no Depression screen [facility] 2/9 10/27/2020 04/01/2020 08/25/2019 04/30/2018 10/21/2017  Decreased Interest 0 0 0 0 0  Down, Depressed, Hopeless 0 0 0 0 0  PHQ - 2 Score 0 0 0 0 0  Altered sleeping 0 0 0 0 -  Tired, decreased energy 0 0 0 0 -  Change in appetite - 0 1 0 -  Feeling bad or failure about yourself  0 0 0 0 -  Trouble concentrating 0 0 0 0 -  Moving slowly or fidgety/restless 0 0 0 0 -  Suicidal thoughts 0 0 0 0 -  PHQ-9 Score 0 0 1 0 -  Difficult doing work/chores Not  difficult at all - Not difficult at all - -    Is the patient deaf or have difficulty hearing?: No Does the patient have difficulty seeing, even when wearing glasses/contacts?: No Does the patient have difficulty concentrating, remembering, or making decisions?: No Does the patient have difficulty walking or climbing stairs?: No Does the patient have difficulty dressing or bathing?: No Does the patient have difficulty doing errands alone such as visiting a doctor's office or shopping?: No  Fall Risk  10/27/2020 04/01/2020 04/30/2018 10/21/2017 04/17/2017  Falls in the past year? 0 0 No No No  Number falls in past yr: 0 0 - - -  Injury with Fall? 0 0 - - -  Risk for fall due to : No Fall Risks - - - -  Follow up Falls evaluation completed - - - -    Depression Screen Depression screen [facility] 2/9 10/27/2020 04/01/2020 08/25/2019 04/30/2018 10/21/2017  Decreased Interest 0 0 0 0 0  Down, Depressed, Hopeless 0 0 0 0 0  PHQ - 2 Score 0 0 0 0 0  Altered sleeping 0 0 0 0 -  Tired, decreased energy 0 0 0 0 -  Change in appetite - 0 1 0 -  Feeling bad or failure about yourself  0 0 0 0 -  Trouble concentrating 0 0 0 0 -  Moving slowly or fidgety/restless 0 0 0 0 -  Suicidal thoughts 0 0 0 0 -  PHQ-9 Score 0 0 1 0 -  Difficult doing work/chores Not difficult at all - Not difficult at all - -      Past Medical History:  Past Medical History:  Diagnosis Date  . Depression   . Hypertension     Surgical History:  Past Surgical History:  Procedure Laterality Date  . BREAST EXCISIONAL BIOPSY Right 2005  . BREAST SURGERY  2003  . CESAREAN SECTION    . SPINE SURGERY      Medications:  Current Outpatient Medications on File Prior to Visit  Medication Sig  . B Complex-C (SUPER B COMPLEX PO) Take by mouth daily.  . cetirizine (ZYRTEC) 10 MG tablet Take 10 mg by mouth daily.  . Cholecalciferol (VITAMIN D-3) 1000 UNITS CAPS Take 1,000 Units by mouth daily.  . [employer] BIOTIN PO Take by mouth.  . diazepam  (VALIUM) 5 MG tablet Take 1 tablet (5 mg total) by mouth every 12 (twelve) hours as needed for anxiety. Muscle cramps  . estradiol-norethindrone (ACTIVELLA) 1-0.5 MG tablet Take by mouth daily.  . rizatriptan (MAXALT) 5 MG tablet TAKE 1 TABLET BY MOUTH ONCE AS NEEDED FOR MIGRAINE FOR  UP TO ONE DOSE. MAY TAKE A  SECOND DOSE AFTER 2 HOURS  IF NEEDED.   No current facility-administered medications on file prior to visit.    Allergies:  No Known Allergies  Social History:  Social History   Socioeconomic History  . Marital status: Married    Spouse name: Not on file  . Number of children: Not on file  . Years of education: Not on file  . Highest education level: Not on file  Occupational History  . Not on file  Tobacco Use  . Smoking status: Never Smoker  . Smokeless tobacco: Never Used  Vaping Use  . Vaping Use: Never used  Substance and Sexual Activity  . Alcohol use: Yes  . Drug use: No  . Sexual activity: Not on file  Other Topics Concern  . Not on file  Social History Narrative  . Not on file   Social Determinants of Health   Financial Resource Strain: Not on file  Food Insecurity: Not on file  Transportation Needs: Not on file  Physical Activity: Not on file  Stress: Not on file  Social Connections: Not on file  Intimate Partner Violence: Not on file   Social History   Tobacco Use  Smoking Status Never Smoker  Smokeless Tobacco Never Used   Social History   Substance and Sexual Activity  Alcohol Use Yes    Family History:  Family History  Problem Relation Age of Onset  . Cancer Mother   . Heart disease Father   . Stroke Maternal Grandmother   . Heart disease Paternal Grandmother   . Breast cancer Paternal Aunt     Past medical history, surgical history, medications, allergies, family history and social history reviewed with patient today and changes made to appropriate areas of the chart.   Review of Systems  All other systems reviewed and are  negative.   All other ROS negative except what is listed above and in the HPI.      Objective:    BP 131/73   Pulse 67   Temp 98 F (36.7 C) (Oral)   Ht 5' 7"  (1.702 m)   Wt 136 lb 6.4 oz (61.9 kg)   SpO2 99%   BMI 21.36 kg/m   Wt Readings from Last 3 Encounters:  10/27/20 136 lb 6.4 oz (61.9 kg)  04/01/20 134 lb (  60.8 kg)  08/25/19 133 lb (60.3 kg)    Physical Exam Vitals and nursing note reviewed.  Constitutional:      General: She is awake. She is not in acute distress.    Appearance: She is well-developed. She is not ill-appearing.  HENT:     Head: Normocephalic and atraumatic.     Right Ear: Hearing, tympanic membrane, ear canal and external ear normal. No drainage.     Left Ear: Hearing, tympanic membrane, ear canal and external ear normal. No drainage.     Nose: Nose normal.     Right Sinus: No maxillary sinus tenderness or frontal sinus tenderness.     Left Sinus: No maxillary sinus tenderness or frontal sinus tenderness.     Mouth/Throat:     Mouth: Mucous membranes are moist.     Pharynx: Oropharynx is clear. Uvula midline. No pharyngeal swelling, oropharyngeal exudate or posterior oropharyngeal erythema.  Eyes:     General: Lids are normal.        Right eye: No discharge.        Left eye: No discharge.     Extraocular Movements: Extraocular movements intact.     Conjunctiva/sclera: Conjunctivae normal.     Pupils: Pupils are equal, round, and reactive to light.     Visual Fields: Right eye visual fields normal and left eye visual fields normal.  Neck:     Thyroid: No thyromegaly.     Vascular: No carotid bruit.     Trachea: Trachea normal.  Cardiovascular:     Rate and Rhythm: Normal rate and regular rhythm.     Heart sounds: Normal heart sounds. No murmur heard. No gallop.   Pulmonary:     Effort: Pulmonary effort is normal. No accessory muscle usage or respiratory distress.     Breath sounds: Normal breath sounds.  Chest:  Breasts:     Right:  Normal. No axillary adenopathy or supraclavicular adenopathy.     Left: Normal. No axillary adenopathy or supraclavicular adenopathy.    Abdominal:     General: Bowel sounds are normal.     Palpations: Abdomen is soft. There is no hepatomegaly or splenomegaly.     Tenderness: There is no abdominal tenderness.  Musculoskeletal:        General: Normal range of motion.     Cervical back: Normal range of motion and neck supple.     Right lower leg: No edema.     Left lower leg: No edema.  Lymphadenopathy:     Head:     Right side of head: No submental, submandibular, tonsillar, preauricular or posterior auricular adenopathy.     Left side of head: No submental, submandibular, tonsillar, preauricular or posterior auricular adenopathy.     Cervical: No cervical adenopathy.     Upper Body:     Right upper body: No supraclavicular, axillary or pectoral adenopathy.     Left upper body: No supraclavicular, axillary or pectoral adenopathy.  Skin:    General: Skin is warm and dry.     Capillary Refill: Capillary refill takes less than 2 seconds.     Findings: No rash.  Neurological:     Mental Status: She is alert and oriented to person, place, and time.     Cranial Nerves: Cranial nerves are intact.     Gait: Gait is intact.     Deep Tendon Reflexes: Reflexes are normal and symmetric.     Reflex Scores:      Brachioradialis reflexes  are 2+ on the right side and 2+ on the left side.      Patellar reflexes are 2+ on the right side and 2+ on the left side. Psychiatric:        Attention and Perception: Attention normal.        Mood and Affect: Mood normal.        Speech: Speech normal.        Behavior: Behavior normal. Behavior is cooperative.        Thought Content: Thought content normal.        Judgment: Judgment normal.      Results for orders placed or performed in visit on 10/27/20  HM PAP SMEAR  Result Value Ref Range   HM Pap smear negative- goes to OBGYN       Assessment &  Plan:   Problem List Items Addressed This Visit      Cardiovascular and Mediastinum   Hypertension    Stable.  Continue with current medication regimen.       Relevant Medications   hydrochlorothiazide (HYDRODIURIL) 12.5 MG tablet   Migraine    Stable.  Continue with current medication regimen.       Relevant Medications   sertraline (ZOLOFT) 50 MG tablet   hydrochlorothiazide (HYDRODIURIL) 12.5 MG tablet     Other   Depression    Stable.  Continue to with current medication regimen.  Return to clinic if symptoms worsen or fail to improve.  Denies SI/HI in office today.        Relevant Medications   sertraline (ZOLOFT) 50 MG tablet   Anxiety    Stable.  Continue with current medication regimen.  Return to clinic if symptoms worsen or fail to improve.       Relevant Medications   sertraline (ZOLOFT) 50 MG tablet    Other Visit Diagnoses    Routine general medical examination at a health care facility    -  Primary   Health Maintenance discussed during visi today. Labs ordered today.    Relevant Orders   Comp Met (CMET)   CBC w/Diff   HgB A1c   TSH   Lipid Profile   Urinalysis, Routine w reflex microscopic   Screening for ischemic heart disease       Labs ordered today.    Relevant Orders   Lipid Profile   Screening for diabetes mellitus       Labs ordered today.    Relevant Orders   HgB A1c   Need for Td vaccine       Patient tolerated vaccine well.   Relevant Orders   Td vaccine greater than or equal to 7yo preservative free IM (Completed)       Preventative Services:  Breast Cancer Screening: 12/07/2019 Cervical Cancer Screening: 12/07/2019 Colon Cancer Screening: Due 2024 Depression Screening: Done Today Diabetes Screening: Done Today Hepatitis C screening: 04/30/2018 HIV Screening: 04/30/2018 Flu Vaccine: Up To Date Obesity Screening: Done Today   Follow up plan: Return in about 6 months (around 04/26/2021) for Depression/Anxiety FU and  HTN.   LABORATORY TESTING:  - Pap smear: done elsewhere  IMMUNIZATIONS:   - Tdap: Tetanus vaccination status reviewed: Td vaccination indicated and given today. - Influenza: Up to date - Pneumovax: Not applicable - Prevnar: Not applicable - Zostavax vaccine: Up to date  SCREENING: -Mammogram: Ordered by GYN Due March 2022  - Colonoscopy: Up to date  - Bone Density: Not applicable    PATIENT COUNSELING:  Advised to take 1 mg of folate supplement per day if capable of pregnancy.   Sexuality: Discussed sexually transmitted diseases, partner selection, use of condoms, avoidance of unintended pregnancy  and contraceptive alternatives.   Advised to avoid cigarette smoking.  I discussed with the patient that most people either abstain from alcohol or drink within safe limits (<=14/week and <=4 drinks/occasion for males, <=7/weeks and <= 3 drinks/occasion for females) and that the risk for alcohol disorders and other health effects rises proportionally with the number of drinks per week and how often a drinker exceeds daily limits.  Discussed cessation/primary prevention of drug use and availability of treatment for abuse.   Diet: Encouraged to adjust caloric intake to maintain  or achieve ideal body weight, to reduce intake of dietary saturated fat and total fat, to limit sodium intake by avoiding high sodium foods and not adding table salt, and to maintain adequate dietary potassium and calcium preferably from fresh fruits, vegetables, and low-fat dairy products.    stressed the importance of regular exercise  Injury prevention: Discussed safety belts, safety helmets, smoke detector, smoking near bedding or upholstery.   Dental health: Discussed importance of regular tooth brushing, flossing, and dental visits.    NEXT PREVENTATIVE PHYSICAL DUE IN 1 YEAR. Return in about 6 months (around 04/26/2021) for Depression/Anxiety FU and HTN.

## 2020-10-27 ENCOUNTER — Other Ambulatory Visit: Payer: Self-pay

## 2020-10-27 ENCOUNTER — Encounter: Payer: Self-pay | Admitting: Nurse Practitioner

## 2020-10-27 ENCOUNTER — Ambulatory Visit (INDEPENDENT_AMBULATORY_CARE_PROVIDER_SITE_OTHER): Payer: 59 | Admitting: Nurse Practitioner

## 2020-10-27 VITALS — BP 131/73 | HR 67 | Temp 98.0°F | Ht 67.0 in | Wt 136.4 lb

## 2020-10-27 DIAGNOSIS — Z136 Encounter for screening for cardiovascular disorders: Secondary | ICD-10-CM | POA: Diagnosis not present

## 2020-10-27 DIAGNOSIS — Z23 Encounter for immunization: Secondary | ICD-10-CM | POA: Diagnosis not present

## 2020-10-27 DIAGNOSIS — G43909 Migraine, unspecified, not intractable, without status migrainosus: Secondary | ICD-10-CM

## 2020-10-27 DIAGNOSIS — F419 Anxiety disorder, unspecified: Secondary | ICD-10-CM

## 2020-10-27 DIAGNOSIS — I10 Essential (primary) hypertension: Secondary | ICD-10-CM

## 2020-10-27 DIAGNOSIS — Z131 Encounter for screening for diabetes mellitus: Secondary | ICD-10-CM

## 2020-10-27 DIAGNOSIS — F325 Major depressive disorder, single episode, in full remission: Secondary | ICD-10-CM | POA: Diagnosis not present

## 2020-10-27 DIAGNOSIS — Z0000 Encounter for general adult medical examination without abnormal findings: Secondary | ICD-10-CM | POA: Diagnosis not present

## 2020-10-27 LAB — URINALYSIS, ROUTINE W REFLEX MICROSCOPIC
Bilirubin, UA: NEGATIVE
Glucose, UA: NEGATIVE
Ketones, UA: NEGATIVE
Leukocytes,UA: NEGATIVE
Nitrite, UA: NEGATIVE
Protein,UA: NEGATIVE
RBC, UA: NEGATIVE
Specific Gravity, UA: 1.01 (ref 1.005–1.030)
Urobilinogen, Ur: 0.2 mg/dL (ref 0.2–1.0)
pH, UA: 7 (ref 5.0–7.5)

## 2020-10-27 MED ORDER — SERTRALINE HCL 50 MG PO TABS: 50.0000 mg | ORAL_TABLET | Freq: Every morning | ORAL | 1 refills | Status: DC

## 2020-10-27 MED ORDER — HYDROCHLOROTHIAZIDE 12.5 MG PO TABS: 12.5000 mg | ORAL_TABLET | Freq: Every day | ORAL | 1 refills | Status: DC

## 2020-10-27 NOTE — Assessment & Plan Note (Signed)
Stable.  Continue with current medication regimen.  Return to clinic if symptoms worsen or fail to improve.

## 2020-10-27 NOTE — Assessment & Plan Note (Signed)
Stable.  Continue with current medication regimen.  

## 2020-10-27 NOTE — Assessment & Plan Note (Signed)
Stable.  Continue with current medication regimen.

## 2020-10-27 NOTE — Assessment & Plan Note (Signed)
Stable.  Continue to with current medication regimen.  Return to clinic if symptoms worsen or fail to improve.  Denies SI/HI in office today.

## 2020-10-28 ENCOUNTER — Other Ambulatory Visit: Payer: Self-pay | Admitting: Obstetrics & Gynecology

## 2020-10-28 DIAGNOSIS — Z1231 Encounter for screening mammogram for malignant neoplasm of breast: Secondary | ICD-10-CM

## 2020-10-28 LAB — LIPID PANEL
Chol/HDL Ratio: 4 ratio (ref 0.0–4.4)
Cholesterol, Total: 238 mg/dL — ABNORMAL HIGH (ref 100–199)
HDL: 60 mg/dL (ref >39)
LDL Chol Calc (NIH): 129 mg/dL — ABNORMAL HIGH (ref 0–99)
Triglycerides: 281 mg/dL — ABNORMAL HIGH (ref 0–149)
VLDL Cholesterol Cal: 49 mg/dL — ABNORMAL HIGH (ref 5–40)

## 2020-10-28 LAB — CBC WITH DIFFERENTIAL/PLATELET
Basophils Absolute: 0.1 10*3/uL (ref 0.0–0.2)
Basos: 1 %
EOS (ABSOLUTE): 0.2 10*3/uL (ref 0.0–0.4)
Eos: 4 %
Hematocrit: 39.1 % (ref 34.0–46.6)
Hemoglobin: 13.2 g/dL (ref 11.1–15.9)
Immature Grans (Abs): 0 10*3/uL (ref 0.0–0.1)
Immature Granulocytes: 0 %
Lymphocytes Absolute: 1.7 10*3/uL (ref 0.7–3.1)
Lymphs: 27 %
MCH: 32.4 pg (ref 26.6–33.0)
MCHC: 33.8 g/dL (ref 31.5–35.7)
MCV: 96 fL (ref 79–97)
Monocytes Absolute: 0.5 10*3/uL (ref 0.1–0.9)
Monocytes: 8 %
Neutrophils Absolute: 3.8 10*3/uL (ref 1.4–7.0)
Neutrophils: 60 %
Platelets: 209 10*3/uL (ref 150–450)
RBC: 4.07 x10E6/uL (ref 3.77–5.28)
RDW: 11.8 % (ref 11.7–15.4)
WBC: 6.3 10*3/uL (ref 3.4–10.8)

## 2020-10-28 LAB — COMPREHENSIVE METABOLIC PANEL
ALT: 29 IU/L (ref 0–32)
AST: 24 IU/L (ref 0–40)
Albumin/Globulin Ratio: 2.2 (ref 1.2–2.2)
Albumin: 4.8 g/dL (ref 3.8–4.9)
Alkaline Phosphatase: 55 IU/L (ref 44–121)
BUN/Creatinine Ratio: 19 (ref 9–23)
BUN: 14 mg/dL (ref 6–24)
Bilirubin Total: 0.3 mg/dL (ref 0.0–1.2)
CO2: 24 mmol/L (ref 20–29)
Calcium: 9.6 mg/dL (ref 8.7–10.2)
Chloride: 101 mmol/L (ref 96–106)
Creatinine, Ser: 0.75 mg/dL (ref 0.57–1.00)
GFR calc Af Amer: 102 mL/min/{1.73_m2} (ref >59)
GFR calc non Af Amer: 88 mL/min/{1.73_m2} (ref >59)
Globulin, Total: 2.2 g/dL (ref 1.5–4.5)
Glucose: 84 mg/dL (ref 65–99)
Potassium: 4 mmol/L (ref 3.5–5.2)
Sodium: 140 mmol/L (ref 134–144)
Total Protein: 7 g/dL (ref 6.0–8.5)

## 2020-10-28 LAB — HEMOGLOBIN A1C
Est. average glucose Bld gHb Est-mCnc: 114 mg/dL
Hgb A1c MFr Bld: 5.6 % (ref 4.8–5.6)

## 2020-10-28 LAB — TSH: TSH: 2.15 u[IU]/mL (ref 0.450–4.500)

## 2020-10-28 NOTE — Progress Notes (Signed)
HI Sierra Curtis,  It was a pleasure meeting you yesterday.  I have reviewed your lab work and overall it looks good.  Your cholesterol is elevated from prior.  I recommend you follow a low fat diet and exercise 5x weekly for at least 30 minutes.  We will recheck this at your 6 month follow up and reassess to see if medication is necessary.  Have a good week and I will see you in 6 months.

## 2020-12-05 ENCOUNTER — Telehealth: Payer: Self-pay

## 2020-12-05 NOTE — Telephone Encounter (Signed)
Copied from [place] [id number]. Topic: Complaint - Billing/Coding >> Dec 02, 2020  4:46 PM Yvette Rack wrote: DOS: 10/27/20 Details of complaint: Pt stated there are 2 charges for the physical appt and she would like it corrected.  How would the patient like to see this issue resolved? Pt requests that the coding be corrected. Pt requests call back. Cb# [phone]     Route to Engineer, building services.   Pt stated she does not recall speaking about things she is being billed for states she does not recall particularly asking other than answering questions that were asked by her new provider since it was the first time she saw her and pt is upset she is being billed pt states ins stated they were billed twice.Pt stated she came in with no issues and did not want to discuss any problems and stated she only answered the questions that were asked. Pt would like to speak with practice admin.

## 2020-12-08 ENCOUNTER — Other Ambulatory Visit: Payer: Self-pay

## 2020-12-08 ENCOUNTER — Ambulatory Visit
Admission: RE | Admit: 2020-12-08 | Discharge: 2020-12-08 | Disposition: A | Discharge status: Home / Self Care | Payer: 59 | Source: Ambulatory Visit | Attending: Obstetrics & Gynecology | Admitting: Obstetrics & Gynecology

## 2020-12-08 DIAGNOSIS — Z1231 Encounter for screening mammogram for malignant neoplasm of breast: Secondary | ICD-10-CM | POA: Diagnosis not present

## 2020-12-08 NOTE — Telephone Encounter (Signed)
Pt wanted a call back from [place] regarding the issues mentioned below, please advise.

## 2020-12-12 NOTE — Telephone Encounter (Signed)
Spoke with patient to answer questions regarding CPE & OV charges on same day. Explained to patient that she was charged for an office visit due to medication refills and other ailments discussed outside of preventative care.  Patient states she will not have insurance in a few months and wanted to know the cost and process for self-pay. Explained to patient that self-pay patients currently receive a 57% discount. Patient states she thinks she will have CPE with GYN and would like to discuss decreasing the frequency of her appointments due to no insurance. Patient agreed to discuss this with Charisse Klinefelter, NP at next appointment.

## 2021-03-07 ENCOUNTER — Other Ambulatory Visit: Payer: Self-pay | Admitting: Nurse Practitioner

## 2021-03-07 NOTE — Telephone Encounter (Signed)
Scheduled 8/4

## 2021-03-07 NOTE — Telephone Encounter (Signed)
Medication Refill - Medication:   rizatriptan (MAXALT) 5 MG tablet  Has the patient contacted their pharmacy? Yes.   Pt wanted to know if Santiago Glad would be willing to refill, if not pt wanted to know if she could be seen virtually to get this filled.    Preferred Pharmacy (with phone number or street name):   [place], [place]  [address] [place] [address]  Phone: [phone] Fax: [phone]    Agent: Please be advised that RX refills may take up to 3 business days. We ask that you follow-up with your pharmacy.

## 2021-03-07 NOTE — Telephone Encounter (Signed)
   Notes to clinic: medication filled by a historical provider  Review for refill   Requested Prescriptions  Pending Prescriptions Disp Refills   rizatriptan (MAXALT) 5 MG tablet 10 tablet     Sig: May repeat in 2 hours if needed      Neurology:  Migraine Therapy - Triptan Passed - 03/07/2021 11:45 AM      Passed - Last BP in normal range    BP Readings from Last 1 Encounters:  10/27/20 131/73          Passed - Valid encounter within last 12 months    Recent Outpatient Visits           4 months ago Routine general medical examination at a health care facility   [facility] Jon Billings, NP   11 months ago Essential hypertension   [facility] Volney American, [place]   1 year ago Essential hypertension   [facility] Vol[facility] American, [place]   1 year ago Essential hypertension   [facility] Volney American, [place]   1 year ago PE (physical exam), annual   [place], [place]       Future Appointments             In 1 month Jon Billings, NP [facility], [place]

## 2021-03-08 ENCOUNTER — Telehealth: Payer: Self-pay

## 2021-03-08 MED ORDER — RIZATRIPTAN BENZOATE 5 MG PO TABS: ORAL_TABLET | ORAL | 0 refills | Status: DC

## 2021-03-08 MED ORDER — RIZATRIPTAN BENZOATE 5 MG PO TABS: ORAL_TABLET | ORAL | 2 refills | Status: DC

## 2021-03-08 NOTE — Telephone Encounter (Signed)
Refills sent on medication.

## 2021-03-08 NOTE — Telephone Encounter (Signed)
Scheduled 8/4  Copied from [place] [id number]. Topic: General - Other >> Mar 08, 2021 10:51 AM Leward Quan A wrote: Reason for CRM: Patient called in to  inquire of Jon Billings why she was not given any refills on the rizatriptan (MAXALT) 5 MG tablet Per patient she would like enough refills to last until her next visit so she does not have to call back every time she is need.  Ph# [phone]

## 2021-04-27 ENCOUNTER — Ambulatory Visit (INDEPENDENT_AMBULATORY_CARE_PROVIDER_SITE_OTHER): Payer: 59 | Admitting: Nurse Practitioner

## 2021-04-27 ENCOUNTER — Encounter: Payer: Self-pay | Admitting: Nurse Practitioner

## 2021-04-27 ENCOUNTER — Other Ambulatory Visit: Payer: Self-pay

## 2021-04-27 VITALS — BP 103/57 | HR 64 | Temp 98.3°F | Wt 128.0 lb

## 2021-04-27 DIAGNOSIS — E785 Hyperlipidemia, unspecified: Secondary | ICD-10-CM | POA: Diagnosis not present

## 2021-04-27 DIAGNOSIS — F325 Major depressive disorder, single episode, in full remission: Secondary | ICD-10-CM

## 2021-04-27 DIAGNOSIS — I10 Essential (primary) hypertension: Secondary | ICD-10-CM

## 2021-04-27 DIAGNOSIS — F419 Anxiety disorder, unspecified: Secondary | ICD-10-CM | POA: Diagnosis not present

## 2021-04-27 MED ORDER — HYDROCHLOROTHIAZIDE 12.5 MG PO TABS: 12.5000 mg | ORAL_TABLET | Freq: Every day | ORAL | 1 refills | Status: DC

## 2021-04-27 MED ORDER — SERTRALINE HCL 50 MG PO TABS: 50.0000 mg | ORAL_TABLET | Freq: Every morning | ORAL | 1 refills | Status: DC

## 2021-04-27 NOTE — Progress Notes (Signed)
BP (!) 103/57   Pulse 64   Temp 98.3 F (36.8 C) (Oral)   Wt 128 lb (58.1 kg)   SpO2 98%   BMI 20.05 kg/m    Subjective:    Patient ID: Sierra Curtis, female    DOB: July 26, 1961, 60 y.o.   MRN: [id number]  HPI: Sierra Curtis is a 60 y.o. female  Chief Complaint  Patient presents with   Depression   Hypertension   Anxiety   DEPRESSION/ANXIETY Patient states she is feeling great. She has no concerns today. Patient denies SI.   [place] [facility] Visit from 04/27/2021 in [facility]  PHQ-9 Total Score 0      HYPERTENSION / [place] Satisfied with current treatment? yes Duration of hypertension: years BP medication side effects: no Past BP meds: HCTZ Duration of hyperlipidemia: years Cholesterol medication side effects: no Cholesterol supplements: none Past cholesterol medications: none Medication compliance: excellent compliance Aspirin: no Recent stressors: no Recurrent headaches: no Visual changes: no Palpitations: no Dyspnea: no Chest pain: no Lower extremity edema: no Dizzy/lightheaded: no    Relevant past medical, surgical, family and social history reviewed and updated as indicated. Interim medical history since our last visit reviewed. Allergies and medications reviewed and updated.  Review of Systems  Eyes:  Negative for visual disturbance.  Respiratory:  Negative for cough, chest tightness and shortness of breath.   Cardiovascular:  Negative for chest pain, palpitations and leg swelling.  Neurological:  Negative for dizziness and headaches.  Psychiatric/Behavioral:  Negative for dysphoric mood and suicidal ideas. The patient is not nervous/anxious.    Per HPI unless specifically indicated above     Objective:    BP (!) 103/57   Pulse 64   Temp 98.3 F (36.8 C) (Oral)   Wt 128 lb (58.1 kg)   SpO2 98%   BMI 20.05 kg/m   Wt Readings from Last 3 Encounters:  04/27/21 128 lb (58.1 kg)  10/27/20 136 lb 6.4 oz (61.9 kg)   04/01/20 134 lb (60.8 kg)    Physical Exam Vitals and nursing note reviewed.  Constitutional:      General: She is not in acute distress.    Appearance: Normal appearance. She is normal weight. She is not ill-appearing, toxic-appearing or diaphoretic.  HENT:     Head: Normocephalic.     Right Ear: External ear normal.     Left Ear: External ear normal.     Nose: Nose normal.     Mouth/Throat:     Mouth: Mucous membranes are moist.     Pharynx: Oropharynx is clear.  Eyes:     General:        Right eye: No discharge.        Left eye: No discharge.     Extraocular Movements: Extraocular movements intact.     Conjunctiva/sclera: Conjunctivae normal.     Pupils: Pupils are equal, round, and reactive to light.  Cardiovascular:     Rate and Rhythm: Normal rate and regular rhythm.     Heart sounds: No murmur heard. Pulmonary:     Effort: Pulmonary effort is normal. No respiratory distress.     Breath sounds: Normal breath sounds. No wheezing or rales.  Musculoskeletal:     Cervical back: Normal range of motion and neck supple.  Skin:    General: Skin is warm and dry.     Capillary Refill: Capillary refill takes less than 2 seconds.  Neurological:     General:  No focal deficit present.     Mental Status: She is alert and oriented to person, place, and time. Mental status is at baseline.  Psychiatric:        Mood and Affect: Mood normal.        Behavior: Behavior normal.        Thought Content: Thought content normal.        Judgment: Judgment normal.    Results for orders placed or performed in visit on 10/27/20  Comp Met (CMET)  Result Value Ref Range   Glucose 84 65 - 99 mg/dL   BUN 14 6 - 24 mg/dL   Creatinine, Ser 0.75 0.57 - 1.00 mg/dL   GFR calc non Af Amer 88 >59 mL/min/1.73   GFR calc Af Amer 102 >59 mL/min/1.73   BUN/Creatinine Ratio 19 9 - 23   Sodium 140 134 - 144 mmol/L   Potassium 4.0 3.5 - 5.2 mmol/L   Chloride 101 96 - 106 mmol/L   CO2 24 20 - 29 mmol/L    Calcium 9.6 8.7 - 10.2 mg/dL   Total Protein 7.0 6.0 - 8.5 g/dL   Albumin 4.8 3.8 - 4.9 g/dL   Globulin, Total 2.2 1.5 - 4.5 g/dL   Albumin/Globulin Ratio 2.2 1.2 - 2.2   Bilirubin Total 0.3 0.0 - 1.2 mg/dL   Alkaline Phosphatase 55 44 - 121 IU/L   AST 24 0 - 40 IU/L   ALT 29 0 - 32 IU/L  CBC w/Diff  Result Value Ref Range   WBC 6.3 3.4 - 10.8 x10E3/uL   RBC 4.07 3.77 - 5.28 x10E6/uL   Hemoglobin 13.2 11.1 - 15.9 g/dL   Hematocrit 39.1 34.0 - 46.6 %   MCV 96 79 - 97 fL   MCH 32.4 26.6 - 33.0 pg   MCHC 33.8 31.5 - 35.7 g/dL   RDW 11.8 11.7 - 15.4 %   Platelets 209 150 - 450 x10E3/uL   Neutrophils 60 Not Estab. %   Lymphs 27 Not Estab. %   Monocytes 8 Not Estab. %   Eos 4 Not Estab. %   Basos 1 Not Estab. %   Neutrophils Absolute 3.8 1.4 - 7.0 x10E3/uL   Lymphocytes Absolute 1.7 0.7 - 3.1 x10E3/uL   Monocytes Absolute 0.5 0.1 - 0.9 x10E3/uL   EOS (ABSOLUTE) 0.2 0.0 - 0.4 x10E3/uL   Basophils Absolute 0.1 0.0 - 0.2 x10E3/uL   Immature Granulocytes 0 Not Estab. %   Immature Grans (Abs) 0.0 0.0 - 0.1 x10E3/uL  HgB A1c  Result Value Ref Range   Hgb A1c MFr Bld 5.6 4.8 - 5.6 %   Est. average glucose Bld gHb Est-mCnc 114 mg/dL  TSH  Result Value Ref Range   TSH 2.150 0.450 - 4.500 uIU/mL  Lipid Profile  Result Value Ref Range   Cholesterol, Total 238 (H) 100 - 199 mg/dL   Triglycerides 281 (H) 0 - 149 mg/dL   HDL 60 >39 mg/dL   VLDL Cholesterol Cal 49 (H) 5 - 40 mg/dL   LDL Chol Calc (NIH) 129 (H) 0 - 99 mg/dL   Chol/HDL Ratio 4.0 0.0 - 4.4 ratio  Urinalysis, Routine w reflex microscopic  Result Value Ref Range   Specific Gravity, UA 1.010 1.005 - 1.030   pH, UA 7.0 5.0 - 7.5   Color, UA Yellow Yellow   Appearance Ur Clear Clear   Leukocytes,UA Negative Negative   Protein,UA Negative Negative/Trace   Glucose, UA Negative Negative   Ketones,  UA Negative Negative   RBC, UA Negative Negative   Bilirubin, UA Negative Negative   Urobilinogen, Ur 0.2 0.2 - 1.0 mg/dL    Nitrite, UA Negative Negative  HM PAP SMEAR  Result Value Ref Range   HM Pap smear negative- goes to OBGYN       Assessment & Plan:   Problem List Items Addressed This Visit       Cardiovascular and Mediastinum   Hypertension    Chronic.  Controlled.  Continue with current medication regimen.  Labs ordered today. Refills sent. Return to clinic in 6 months for reevaluation.  Call sooner if concerns arise.         Relevant Medications   hydrochlorothiazide (HYDRODIURIL) 12.5 MG tablet   Other Relevant Orders   Comp Met (CMET)   Lipid Profile     Other   Depression - Primary    Chronic.  Controlled.  Continue with current medication regimen.  Labs ordered today. Refills sent.  Return to clinic in 6 months for reevaluation.  Call sooner if concerns arise.         Relevant Medications   sertraline (ZOLOFT) 50 MG tablet   Anxiety    Chronic.  Controlled.  Continue with current medication regimen.  Labs ordered today. Refills sent today. Return to clinic in 6 months for reevaluation.  Call sooner if concerns arise.         Relevant Medications   sertraline (ZOLOFT) 50 MG tablet   Other Visit Diagnoses     Hyperlipidemia, unspecified hyperlipidemia type       Relevant Medications   hydrochlorothiazide (HYDRODIURIL) 12.5 MG tablet   Other Relevant Orders   Lipid Profile        Follow up plan: Return in about 6 months (around 10/28/2021) for Physical and Fasting labs.

## 2021-04-27 NOTE — Assessment & Plan Note (Signed)
Chronic.  Controlled.  Continue with current medication regimen.  Labs ordered today.  Refills sent today.  Return to clinic in 6 months for reevaluation.  Call sooner if concerns arise.   

## 2021-04-27 NOTE — Assessment & Plan Note (Signed)
Chronic.  Controlled.  Continue with current medication regimen.  Labs ordered today.  Refills sent.  Return to clinic in 6 months for reevaluation.  Call sooner if concerns arise.  

## 2021-04-28 LAB — LIPID PANEL
Chol/HDL Ratio: 3.8 ratio (ref 0.0–4.4)
Cholesterol, Total: 245 mg/dL — ABNORMAL HIGH (ref 100–199)
HDL: 64 mg/dL (ref >39)
LDL Chol Calc (NIH): 161 mg/dL — ABNORMAL HIGH (ref 0–99)
Triglycerides: 112 mg/dL (ref 0–149)
VLDL Cholesterol Cal: 20 mg/dL (ref 5–40)

## 2021-04-28 LAB — COMPREHENSIVE METABOLIC PANEL
ALT: 22 IU/L (ref 0–32)
AST: 21 IU/L (ref 0–40)
Albumin/Globulin Ratio: 2.4 — ABNORMAL HIGH (ref 1.2–2.2)
Albumin: 4.7 g/dL (ref 3.8–4.9)
Alkaline Phosphatase: 51 IU/L (ref 44–121)
BUN/Creatinine Ratio: 12 (ref 9–23)
BUN: 9 mg/dL (ref 6–24)
Bilirubin Total: 0.6 mg/dL (ref 0.0–1.2)
CO2: 27 mmol/L (ref 20–29)
Calcium: 9.7 mg/dL (ref 8.7–10.2)
Chloride: 102 mmol/L (ref 96–106)
Creatinine, Ser: 0.78 mg/dL (ref 0.57–1.00)
Globulin, Total: 2 g/dL (ref 1.5–4.5)
Glucose: 85 mg/dL (ref 65–99)
Potassium: 4.2 mmol/L (ref 3.5–5.2)
Sodium: 141 mmol/L (ref 134–144)
Total Protein: 6.7 g/dL (ref 6.0–8.5)
eGFR: 88 mL/min/{1.73_m2} (ref >59)

## 2021-04-28 NOTE — Progress Notes (Signed)
Hi Sierra Curtis.  Your lab work looks good.  Cholesterol remains elevated.  I recommend following a low fat diet and exercising 5x weekly for at least 30 mins as tolerated.

## 2021-05-16 ENCOUNTER — Other Ambulatory Visit: Payer: Self-pay | Admitting: Nurse Practitioner

## 2021-05-16 DIAGNOSIS — F325 Major depressive disorder, single episode, in full remission: Secondary | ICD-10-CM

## 2021-06-12 ENCOUNTER — Other Ambulatory Visit: Payer: Self-pay | Admitting: Nurse Practitioner

## 2021-06-12 DIAGNOSIS — I10 Essential (primary) hypertension: Secondary | ICD-10-CM

## 2021-06-12 NOTE — Telephone Encounter (Signed)
Last ordered on 04/27/21 The request went to [facility]. TC to [facility] pharmacist verified the patient had not picked up medication. Approved per protocol-[facility] requested.

## 2021-10-30 ENCOUNTER — Ambulatory Visit (INDEPENDENT_AMBULATORY_CARE_PROVIDER_SITE_OTHER): Payer: 59 | Admitting: Nurse Practitioner

## 2021-10-30 ENCOUNTER — Other Ambulatory Visit: Payer: Self-pay

## 2021-10-30 ENCOUNTER — Encounter: Payer: Self-pay | Admitting: Nurse Practitioner

## 2021-10-30 VITALS — BP 102/60 | HR 74 | Temp 98.5°F | Ht 67.2 in | Wt 133.4 lb

## 2021-10-30 DIAGNOSIS — F325 Major depressive disorder, single episode, in full remission: Secondary | ICD-10-CM

## 2021-10-30 DIAGNOSIS — R8290 Unspecified abnormal findings in urine: Secondary | ICD-10-CM | POA: Diagnosis not present

## 2021-10-30 DIAGNOSIS — R69 Illness, unspecified: Secondary | ICD-10-CM | POA: Diagnosis not present

## 2021-10-30 DIAGNOSIS — Z0000 Encounter for general adult medical examination without abnormal findings: Secondary | ICD-10-CM

## 2021-10-30 DIAGNOSIS — F419 Anxiety disorder, unspecified: Secondary | ICD-10-CM | POA: Diagnosis not present

## 2021-10-30 DIAGNOSIS — I10 Essential (primary) hypertension: Secondary | ICD-10-CM

## 2021-10-30 LAB — URINALYSIS, ROUTINE W REFLEX MICROSCOPIC
Bilirubin, UA: NEGATIVE
Glucose, UA: NEGATIVE
Ketones, UA: NEGATIVE
Nitrite, UA: NEGATIVE
Protein,UA: NEGATIVE
RBC, UA: NEGATIVE
Specific Gravity, UA: 1.015 (ref 1.005–1.030)
Urobilinogen, Ur: 0.2 mg/dL (ref 0.2–1.0)
pH, UA: 7 (ref 5.0–7.5)

## 2021-10-30 LAB — MICROSCOPIC EXAMINATION: RBC, Urine: NONE SEEN /hpf (ref 0–2)

## 2021-10-30 MED ORDER — HYDROCHLOROTHIAZIDE 12.5 MG PO TABS: 12.5000 mg | ORAL_TABLET | Freq: Every day | ORAL | 1 refills | Status: DC

## 2021-10-30 MED ORDER — RIZATRIPTAN BENZOATE 5 MG PO TABS: ORAL_TABLET | ORAL | 2 refills | Status: DC

## 2021-10-30 MED ORDER — SERTRALINE HCL 50 MG PO TABS: 50.0000 mg | ORAL_TABLET | Freq: Every morning | ORAL | 1 refills | Status: DC

## 2021-10-30 NOTE — Assessment & Plan Note (Signed)
Chronic.  Controlled.  Continue with current medication regimen on Zoloft 50mg daily.  Refill sent today.  Labs ordered today.  Return to clinic in 6 months for reevaluation.  Call sooner if concerns arise.   

## 2021-10-30 NOTE — Addendum Note (Signed)
Addended by: Jon Billings on: 10/30/2021 11:10 AM   Modules accepted: Orders

## 2021-10-30 NOTE — Assessment & Plan Note (Signed)
Chronic.  Controlled.  Continue with current medication regimen on HCTZ 12.5mg.  Refill sent today.  Labs ordered today.  Return to clinic in 6 months for reevaluation.  Call sooner if concerns arise.   

## 2021-10-30 NOTE — Progress Notes (Signed)
BP 102/60    Pulse 74    Temp 98.5 F (36.9 C) (Oral)    Ht 5' 7.2" (1.707 m)    Wt 133 lb 6.4 oz (60.5 kg)    SpO2 99%    BMI 20.77 kg/m    Subjective:    Patient ID: Sierra Curtis, female    DOB: 06/05/1961, 61 y.o.   MRN: [id number]  HPI: Sierra Curtis is a 61 y.o. female presenting on 10/30/2021 for comprehensive medical examination. Current medical complaints include:none  She currently lives with: Menopausal Symptoms: no  HYPERTENSION Hypertension status: controlled  Satisfied with current treatment? no Duration of hypertension: years BP monitoring frequency:  not checking BP range:  BP medication side effects:  no Medication compliance: excellent compliance Previous BP meds:HCTZ Aspirin: no Recurrent headaches: no Visual changes: no Palpitations: no Dyspnea: no Chest pain: no Lower extremity edema: no Dizzy/lightheaded: no  DEPRESSION/ANXIETY Patient states she is doing great.  She has had any issues with her mood in several years.   Depression Screen done today and results listed below:  Depression screen [facility] 2/9 10/30/2021 04/27/2021 10/27/2020 04/01/2020 08/25/2019  Decreased Interest 0 0 0 0 0  Down, Depressed, Hopeless 0 0 0 0 0  PHQ - 2 Score 0 0 0 0 0  Altered sleeping 0 0 0 0 0  Tired, decreased energy 0 0 0 0 0  Change in appetite 0 0 - 0 1  Feeling bad or failure about yourself  0 0 0 0 0  Trouble concentrating 0 0 0 0 0  Moving slowly or fidgety/restless 0 0 0 0 0  Suicidal thoughts 0 0 0 0 0  PHQ-9 Score 0 0 0 0 1  Difficult doing work/chores Not difficult at all Not difficult at all Not difficult at all - Not difficult at all    The patient does not have a history of falls. I did complete a risk assessment for falls. A plan of care for falls was documented.   Past Medical History:  Past Medical History:  Diagnosis Date   Depression    Hypertension     Surgical History:  Past Surgical History:  Procedure Laterality Date   BREAST EXCISIONAL  BIOPSY Right 2005   BREAST SURGERY  2003   CESAREAN SECTION     SPINE SURGERY      Medications:  Current Outpatient Medications on File Prior to Visit  Medication Sig   B Complex-C (SUPER B COMPLEX PO) Take by mouth daily.   cetirizine (ZYRTEC) 10 MG tablet Take 10 mg by mouth daily.   Cholecalciferol (VITAMIN D-3) 1000 UNITS CAPS Take 1,000 Units by mouth daily.   [employer] BIOTIN PO Take by mouth.   diazepam (VALIUM) 5 MG tablet Take 1 tablet (5 mg total) by mouth every 12 (twelve) hours as needed for anxiety. Muscle cramps   No current facility-administered medications on file prior to visit.    Allergies:  No Known Allergies  Social History:  Social History   Socioeconomic History   Marital status: Married    Spouse name: Not on file   Number of children: Not on file   Years of education: Not on file   Highest education level: Not on file  Occupational History   Not on file  Tobacco Use   Smoking status: Never   Smokeless tobacco: Never  Vaping Use   Vaping Use: Never used  Substance and Sexual Activity   Alcohol use: Yes  Comment: on occasion   Drug use: No   Sexual activity: Yes  Other Topics Concern   Not on file  Social History Narrative   Not on file   Social Determinants of Health   Financial Resource Strain: Not on file  Food Insecurity: Not on file  Transportation Needs: Not on file  Physical Activity: Not on file  Stress: Not on file  Social Connections: Not on file  Intimate Partner Violence: Not on file   Social History   Tobacco Use  Smoking Status Never  Smokeless Tobacco Never   Social History   Substance and Sexual Activity  Alcohol Use Yes   Comment: on occasion    Family History:  Family History  Problem Relation Age of Onset   Cancer Mother    Heart disease Father    Diabetes Sister    Breast cancer Paternal Aunt    Stroke Maternal Grandmother    Heart disease Maternal Grandfather    Heart disease Paternal Grandmother     Congestive Heart Failure Paternal Grandmother    Heart disease Paternal Grandfather    Heart attack Paternal Grandfather     Past medical history, surgical history, medications, allergies, family history and social history reviewed with patient today and changes made to appropriate areas of the chart.   Review of Systems  Eyes:  Negative for blurred vision and double vision.  Respiratory:  Negative for shortness of breath.   Cardiovascular:  Negative for chest pain, palpitations and leg swelling.  Neurological:  Negative for dizziness and headaches.  All other ROS negative except what is listed above and in the HPI.      Objective:    BP 102/60    Pulse 74    Temp 98.5 F (36.9 C) (Oral)    Ht 5' 7.2" (1.707 m)    Wt 133 lb 6.4 oz (60.5 kg)    SpO2 99%    BMI 20.77 kg/m   Wt Readings from Last 3 Encounters:  10/30/21 133 lb 6.4 oz (60.5 kg)  04/27/21 128 lb (58.1 kg)  10/27/20 136 lb 6.4 oz (61.9 kg)    Physical Exam Vitals and nursing note reviewed.  Constitutional:      General: She is awake. She is not in acute distress.    Appearance: She is well-developed and normal weight. She is not ill-appearing.  HENT:     Head: Normocephalic and atraumatic.     Right Ear: Hearing, tympanic membrane, ear canal and external ear normal. No drainage.     Left Ear: Hearing, tympanic membrane, ear canal and external ear normal. No drainage.     Nose: Nose normal.     Right Sinus: No maxillary sinus tenderness or frontal sinus tenderness.     Left Sinus: No maxillary sinus tenderness or frontal sinus tenderness.     Mouth/Throat:     Mouth: Mucous membranes are moist.     Pharynx: Oropharynx is clear. Uvula midline. No pharyngeal swelling, oropharyngeal exudate or posterior oropharyngeal erythema.  Eyes:     General: Lids are normal.        Right eye: No discharge.        Left eye: No discharge.     Extraocular Movements: Extraocular movements intact.     Conjunctiva/sclera:  Conjunctivae normal.     Pupils: Pupils are equal, round, and reactive to light.     Visual Fields: Right eye visual fields normal and left eye visual fields normal.  Neck:  Thyroid: No thyromegaly.     Vascular: No carotid bruit.     Trachea: Trachea normal.  Cardiovascular:     Rate and Rhythm: Normal rate and regular rhythm.     Heart sounds: Normal heart sounds. No murmur heard.   No gallop.  Pulmonary:     Effort: Pulmonary effort is normal. No accessory muscle usage or respiratory distress.     Breath sounds: Normal breath sounds.  Chest:  Breasts:    Right: Normal.     Left: Normal.  Abdominal:     General: Bowel sounds are normal.     Palpations: Abdomen is soft. There is no hepatomegaly or splenomegaly.     Tenderness: There is no abdominal tenderness.  Musculoskeletal:        General: Normal range of motion.     Cervical back: Normal range of motion and neck supple.     Right lower leg: No edema.     Left lower leg: No edema.  Lymphadenopathy:     Head:     Right side of head: No submental, submandibular, tonsillar, preauricular or posterior auricular adenopathy.     Left side of head: No submental, submandibular, tonsillar, preauricular or posterior auricular adenopathy.     Cervical: No cervical adenopathy.     Upper Body:     Right upper body: No supraclavicular, axillary or pectoral adenopathy.     Left upper body: No supraclavicular, axillary or pectoral adenopathy.  Skin:    General: Skin is warm and dry.     Capillary Refill: Capillary refill takes less than 2 seconds.     Findings: No rash.  Neurological:     Mental Status: She is alert and oriented to person, place, and time.     Gait: Gait is intact.     Deep Tendon Reflexes: Reflexes are normal and symmetric.     Reflex Scores:      Brachioradialis reflexes are 2+ on the right side and 2+ on the left side.      Patellar reflexes are 2+ on the right side and 2+ on the left side. Psychiatric:         Attention and Perception: Attention normal.        Mood and Affect: Mood normal.        Speech: Speech normal.        Behavior: Behavior normal. Behavior is cooperative.        Thought Content: Thought content normal.        Judgment: Judgment normal.    Results for orders placed or performed in visit on 04/27/21  Comp Met (CMET)  Result Value Ref Range   Glucose 85 65 - 99 mg/dL   BUN 9 6 - 24 mg/dL   Creatinine, Ser 0.78 0.57 - 1.00 mg/dL   eGFR 88 >59 mL/min/1.73   BUN/Creatinine Ratio 12 9 - 23   Sodium 141 134 - 144 mmol/L   Potassium 4.2 3.5 - 5.2 mmol/L   Chloride 102 96 - 106 mmol/L   CO2 27 20 - 29 mmol/L   Calcium 9.7 8.7 - 10.2 mg/dL   Total Protein 6.7 6.0 - 8.5 g/dL   Albumin 4.7 3.8 - 4.9 g/dL   Globulin, Total 2.0 1.5 - 4.5 g/dL   Albumin/Globulin Ratio 2.4 (H) 1.2 - 2.2   Bilirubin Total 0.6 0.0 - 1.2 mg/dL   Alkaline Phosphatase 51 44 - 121 IU/L   AST 21 0 - 40 IU/L   ALT  22 0 - 32 IU/L  Lipid Profile  Result Value Ref Range   Cholesterol, Total 245 (H) 100 - 199 mg/dL   Triglycerides 112 0 - 149 mg/dL   HDL 64 >39 mg/dL   VLDL Cholesterol Cal 20 5 - 40 mg/dL   LDL Chol Calc (NIH) 161 (H) 0 - 99 mg/dL   Chol/HDL Ratio 3.8 0.0 - 4.4 ratio      Assessment & Plan:   Problem List Items Addressed This Visit       Cardiovascular and Mediastinum   Hypertension    Chronic.  Controlled.  Continue with current medication regimen on HCTZ 12.52m.  Refill sent today.  Labs ordered today.  Return to clinic in 6 months for reevaluation.  Call sooner if concerns arise.        Relevant Medications   hydrochlorothiazide (HYDRODIURIL) 12.5 MG tablet     Other   Depression    Chronic.  Controlled.  Continue with current medication regimen on Zoloft 559mdaily.  Refill sent today.  Labs ordered today.  Return to clinic in 6 months for reevaluation.  Call sooner if concerns arise.        Relevant Medications   sertraline (ZOLOFT) 50 MG tablet   Anxiety     Chronic.  Controlled.  Continue with current medication regimen on Zoloft 5036maily.  Refill sent today.  Labs ordered today.  Return to clinic in 6 months for reevaluation.  Call sooner if concerns arise.        Relevant Medications   sertraline (ZOLOFT) 50 MG tablet   Other Visit Diagnoses     Annual physical exam    -  Primary   Health maintenance reviewed during visit. Labs ordered today. Up to date on Mammogram and Colonoscopy.    Relevant Orders   CBC with Differential/Platelet   Comprehensive metabolic panel   Lipid panel   TSH   Urinalysis, Routine w reflex microscopic        Follow up plan: Return in about 6 months (around 04/29/2022) for BP Check, Depression/Anxiety FU.   LABORATORY TESTING:  - Pap smear: done elsewhere  IMMUNIZATIONS:   - Tdap: Tetanus vaccination status reviewed: last tetanus booster within 10 years. - Influenza: Up to date - Pneumovax: Not applicable - Prevnar: Not applicable - COVID: Up to date - HPV: Not applicable - Shingrix vaccine: Up to date  SCREENING: -Mammogram: Up to date  - Colonoscopy: Up to date  - Bone Density: Not applicable  -Hearing Test: Not applicable  -Spirometry: Not applicable   PATIENT COUNSELING:   Advised to take 1 mg of folate supplement per day if capable of pregnancy.   Sexuality: Discussed sexually transmitted diseases, partner selection, use of condoms, avoidance of unintended pregnancy  and contraceptive alternatives.   Advised to avoid cigarette smoking.  I discussed with the patient that most people either abstain from alcohol or drink within safe limits (<=14/week and <=4 drinks/occasion for males, <=7/weeks and <= 3 drinks/occasion for females) and that the risk for alcohol disorders and other health effects rises proportionally with the number of drinks per week and how often a drinker exceeds daily limits.  Discussed cessation/primary prevention of drug use and availability of treatment for abuse.    Diet: Encouraged to adjust caloric intake to maintain  or achieve ideal body weight, to reduce intake of dietary saturated fat and total fat, to limit sodium intake by avoiding high sodium foods and not adding table salt, and  to maintain adequate dietary potassium and calcium preferably from fresh fruits, vegetables, and low-fat dairy products.    stressed the importance of regular exercise  Injury prevention: Discussed safety belts, safety helmets, smoke detector, smoking near bedding or upholstery.   Dental health: Discussed importance of regular tooth brushing, flossing, and dental visits.    NEXT PREVENTATIVE PHYSICAL DUE IN 1 YEAR. Return in about 6 months (around 04/29/2022) for BP Check, Depression/Anxiety FU.

## 2021-10-31 ENCOUNTER — Encounter: Payer: Self-pay | Admitting: Nurse Practitioner

## 2021-10-31 LAB — COMPREHENSIVE METABOLIC PANEL
ALT: 28 IU/L (ref 0–32)
AST: 22 IU/L (ref 0–40)
Albumin/Globulin Ratio: 2.7 — ABNORMAL HIGH (ref 1.2–2.2)
Albumin: 4.9 g/dL (ref 3.8–4.9)
Alkaline Phosphatase: 55 IU/L (ref 44–121)
BUN/Creatinine Ratio: 18 (ref 9–23)
BUN: 14 mg/dL (ref 6–24)
Bilirubin Total: 0.4 mg/dL (ref 0.0–1.2)
CO2: 27 mmol/L (ref 20–29)
Calcium: 9.8 mg/dL (ref 8.7–10.2)
Chloride: 97 mmol/L (ref 96–106)
Creatinine, Ser: 0.8 mg/dL (ref 0.57–1.00)
Globulin, Total: 1.8 g/dL (ref 1.5–4.5)
Glucose: 95 mg/dL (ref 70–99)
Potassium: 4.2 mmol/L (ref 3.5–5.2)
Sodium: 139 mmol/L (ref 134–144)
Total Protein: 6.7 g/dL (ref 6.0–8.5)
eGFR: 85 mL/min/{1.73_m2} (ref >59)

## 2021-10-31 LAB — LIPID PANEL
Chol/HDL Ratio: 4.1 ratio (ref 0.0–4.4)
Cholesterol, Total: 231 mg/dL — ABNORMAL HIGH (ref 100–199)
HDL: 56 mg/dL (ref >39)
LDL Chol Calc (NIH): 149 mg/dL — ABNORMAL HIGH (ref 0–99)
Triglycerides: 143 mg/dL (ref 0–149)
VLDL Cholesterol Cal: 26 mg/dL (ref 5–40)

## 2021-10-31 LAB — CBC WITH DIFFERENTIAL/PLATELET
Basophils Absolute: 0 10*3/uL (ref 0.0–0.2)
Basos: 0 %
EOS (ABSOLUTE): 0.1 10*3/uL (ref 0.0–0.4)
Eos: 3 %
Hematocrit: 38.4 % (ref 34.0–46.6)
Hemoglobin: 13 g/dL (ref 11.1–15.9)
Immature Grans (Abs): 0 10*3/uL (ref 0.0–0.1)
Immature Granulocytes: 0 %
Lymphocytes Absolute: 1.5 10*3/uL (ref 0.7–3.1)
Lymphs: 31 %
MCH: 31.8 pg (ref 26.6–33.0)
MCHC: 33.9 g/dL (ref 31.5–35.7)
MCV: 94 fL (ref 79–97)
Monocytes Absolute: 0.5 10*3/uL (ref 0.1–0.9)
Monocytes: 10 %
Neutrophils Absolute: 2.7 10*3/uL (ref 1.4–7.0)
Neutrophils: 56 %
Platelets: 208 10*3/uL (ref 150–450)
RBC: 4.09 x10E6/uL (ref 3.77–5.28)
RDW: 12.3 % (ref 11.7–15.4)
WBC: 4.9 10*3/uL (ref 3.4–10.8)

## 2021-10-31 LAB — TSH: TSH: 3.83 u[IU]/mL (ref 0.450–4.500)

## 2021-10-31 NOTE — Progress Notes (Signed)
Hi Sierra Curtis. It was good to see you yesterday.  Your blood work looks good.  Your cholesterol is slightly elevated.  I recommend a low fat diet and exercise.  Your urine showed some possible bacteria so I sent it for culture to see if any antibiotics are needed. I will let you know once that returns.  Please let me know if you have any questions.  Follow up as discussed.

## 2021-11-01 LAB — URINE CULTURE: Organism ID, Bacteria: NO GROWTH

## 2021-11-01 NOTE — Progress Notes (Signed)
Hi Sierra Curtis.  No growth on your urine sample.  No need for antibiotics.

## 2021-12-22 ENCOUNTER — Other Ambulatory Visit: Payer: Self-pay

## 2021-12-22 DIAGNOSIS — Z1231 Encounter for screening mammogram for malignant neoplasm of breast: Secondary | ICD-10-CM

## 2021-12-27 DIAGNOSIS — R69 Illness, unspecified: Secondary | ICD-10-CM | POA: Diagnosis not present

## 2021-12-27 DIAGNOSIS — Z124 Encounter for screening for malignant neoplasm of cervix: Secondary | ICD-10-CM | POA: Diagnosis not present

## 2021-12-27 DIAGNOSIS — Z1231 Encounter for screening mammogram for malignant neoplasm of breast: Secondary | ICD-10-CM | POA: Diagnosis not present

## 2021-12-27 DIAGNOSIS — Z01419 Encounter for gynecological examination (general) (routine) without abnormal findings: Secondary | ICD-10-CM | POA: Diagnosis not present

## 2021-12-27 LAB — RESULTS CONSOLE HPV: CHL HPV: NEGATIVE

## 2021-12-27 LAB — HM PAP SMEAR

## 2022-01-04 ENCOUNTER — Encounter: Payer: Self-pay | Admitting: Nurse Practitioner

## 2022-01-29 ENCOUNTER — Ambulatory Visit
Admission: RE | Admit: 2022-01-29 | Discharge: 2022-01-29 | Disposition: A | Discharge status: Home / Self Care | Payer: 59 | Source: Ambulatory Visit

## 2022-01-29 DIAGNOSIS — Z1231 Encounter for screening mammogram for malignant neoplasm of breast: Secondary | ICD-10-CM | POA: Insufficient documentation

## 2022-04-25 ENCOUNTER — Encounter: Payer: Self-pay | Admitting: Nurse Practitioner

## 2022-05-07 ENCOUNTER — Ambulatory Visit: Payer: 59 | Admitting: Nurse Practitioner

## 2022-05-11 DIAGNOSIS — L821 Other seborrheic keratosis: Secondary | ICD-10-CM | POA: Diagnosis not present

## 2022-05-23 NOTE — Progress Notes (Unsigned)
   There were no vitals taken for this visit.   Subjective:    Patient ID: Sierra Curtis, female    DOB: 12-11-1960, 61 y.o.   MRN: [id number]  HPI: Sierra Curtis is a 61 y.o. female  No chief complaint on file.  HYPERTENSION Hypertension status: controlled  Satisfied with current treatment? no Duration of hypertension: years BP monitoring frequency:  not checking BP range:  BP medication side effects:  no Medication compliance: excellent compliance Previous BP meds:HCTZ Aspirin: no Recurrent headaches: no Visual changes: no Palpitations: no Dyspnea: no Chest pain: no Lower extremity edema: no Dizzy/lightheaded: no  DEPRESSION/ANXIETY Patient states she is doing great.  She has had any issues with her mood in several years.  Relevant past medical, surgical, family and social history reviewed and updated as indicated. Interim medical history since our last visit reviewed. Allergies and medications reviewed and updated.  Review of Systems  Per HPI unless specifically indicated above     Objective:    There were no vitals taken for this visit.  Wt Readings from Last 3 Encounters:  10/30/21 133 lb 6.4 oz (60.5 kg)  04/27/21 128 lb (58.1 kg)  10/27/20 136 lb 6.4 oz (61.9 kg)    Physical Exam  Results for orders placed or performed in visit on 01/04/22  HM PAP SMEAR  Result Value Ref Range   HM Pap smear see report scanned into chart   Results Console HPV  Result Value Ref Range   CHL HPV Negative       Assessment & Plan:   Problem List Items Addressed This Visit       Cardiovascular and Mediastinum   Hypertension - Primary     Other   Depression   Anxiety     Follow up plan: No follow-ups on file.

## 2022-05-24 ENCOUNTER — Ambulatory Visit: Payer: 59 | Admitting: Nurse Practitioner

## 2022-05-24 ENCOUNTER — Encounter: Payer: Self-pay | Admitting: Nurse Practitioner

## 2022-05-24 VITALS — BP 132/74 | HR 63 | Temp 99.7°F | Wt 132.2 lb

## 2022-05-24 DIAGNOSIS — F325 Major depressive disorder, single episode, in full remission: Secondary | ICD-10-CM | POA: Diagnosis not present

## 2022-05-24 DIAGNOSIS — F419 Anxiety disorder, unspecified: Secondary | ICD-10-CM

## 2022-05-24 DIAGNOSIS — I10 Essential (primary) hypertension: Secondary | ICD-10-CM | POA: Diagnosis not present

## 2022-05-24 DIAGNOSIS — R69 Illness, unspecified: Secondary | ICD-10-CM | POA: Diagnosis not present

## 2022-05-24 MED ORDER — SERTRALINE HCL 50 MG PO TABS: 50.0000 mg | ORAL_TABLET | Freq: Every morning | ORAL | 1 refills | Status: DC

## 2022-05-24 MED ORDER — HYDROCHLOROTHIAZIDE 12.5 MG PO TABS: 12.5000 mg | ORAL_TABLET | Freq: Every day | ORAL | 1 refills | Status: DC

## 2022-05-24 NOTE — Assessment & Plan Note (Signed)
Chronic.  Controlled.  Continue with current medication regimen on HCTZ 12.5mg.  Refill sent today.  Labs ordered today.  Return to clinic in 6 months for reevaluation.  Call sooner if concerns arise.   

## 2022-05-24 NOTE — Assessment & Plan Note (Signed)
Chronic.  Controlled.  Continue with current medication regimen on Zoloft 50mg daily.  Refill sent today.  Labs ordered today.  Return to clinic in 6 months for reevaluation.  Call sooner if concerns arise.   

## 2022-05-25 LAB — COMPREHENSIVE METABOLIC PANEL
ALT: 29 IU/L (ref 0–32)
AST: 25 IU/L (ref 0–40)
Albumin/Globulin Ratio: 2.1 (ref 1.2–2.2)
Albumin: 4.6 g/dL (ref 3.8–4.9)
Alkaline Phosphatase: 63 IU/L (ref 44–121)
BUN/Creatinine Ratio: 21 (ref 12–28)
BUN: 15 mg/dL (ref 8–27)
Bilirubin Total: 0.4 mg/dL (ref 0.0–1.2)
CO2: 28 mmol/L (ref 20–29)
Calcium: 9.9 mg/dL (ref 8.7–10.3)
Chloride: 101 mmol/L (ref 96–106)
Creatinine, Ser: 0.73 mg/dL (ref 0.57–1.00)
Globulin, Total: 2.2 g/dL (ref 1.5–4.5)
Glucose: 74 mg/dL (ref 70–99)
Potassium: 4.3 mmol/L (ref 3.5–5.2)
Sodium: 142 mmol/L (ref 134–144)
Total Protein: 6.8 g/dL (ref 6.0–8.5)
eGFR: 94 mL/min/{1.73_m2} (ref >59)

## 2022-05-25 NOTE — Progress Notes (Signed)
Hi Sierra Curtis. Your lab work looks great.  No concerns at this time.  Follow up as discussed.

## 2022-05-30 ENCOUNTER — Encounter: Payer: Self-pay | Admitting: Nurse Practitioner

## 2022-06-05 ENCOUNTER — Telehealth: Payer: Self-pay | Admitting: Nurse Practitioner

## 2022-06-05 NOTE — Telephone Encounter (Signed)
Copied from [facility] [id number]. Topic: General - Inquiry >> Jun 05, 2022 10:50 AM Marcellus Scott wrote: Reason for CRM: Pt stated she received a bill for $50.00 and paid a $50.00 co-payment from the date of service, 05/24/2022. Pt stated she was advised Jon Billings is not credentialed correctly with [employer]. Processing as a specialist, not a primary care physician.  When asked if she had reached out to the billing department first, pt said no; she declined to reach out and stated this is an issue with [place] not billing.  Please advise.

## 2022-06-12 NOTE — Telephone Encounter (Signed)
Contacted patient and advised that provider is credentialed correctly and claim will be resubmitted to insurance company for additional payment.

## 2022-07-12 DIAGNOSIS — Z23 Encounter for immunization: Secondary | ICD-10-CM | POA: Diagnosis not present

## 2022-11-21 DIAGNOSIS — E7800 Pure hypercholesterolemia, unspecified: Secondary | ICD-10-CM | POA: Insufficient documentation

## 2022-11-21 NOTE — Progress Notes (Unsigned)
There were no vitals taken for this visit.   Subjective:    Patient ID: Sierra Curtis, female    DOB: [birth year], 62 y.o.   MRN: [id number]  HPI: Sierra Curtis is a 62 y.o. female presenting on 11/22/2022 for comprehensive medical examination. Current medical complaints include:none  She currently lives with: Menopausal Symptoms: no  HYPERTENSION Hypertension status: controlled  Satisfied with current treatment? no Duration of hypertension: years BP monitoring frequency:  not checking BP range:  BP medication side effects:  no Medication compliance: excellent compliance Previous BP meds:HCTZ Aspirin: no Recurrent headaches: no Visual changes: no Palpitations: no Dyspnea: no Chest pain: no Lower extremity edema: no Dizzy/lightheaded: no  DEPRESSION/ANXIETY Patient states she is doing great.  She has had any issues with her mood in several years.   Depression Screen done today and results listed below:     05/24/2022    9:50 AM 10/30/2021    9:15 AM 04/27/2021   10:05 AM 10/27/2020    3:22 PM 04/01/2020    9:11 AM  Depression screen PHQ 2/9  Decreased Interest 0 0 0 0 0  Down, Depressed, Hopeless 0 0 0 0 0  PHQ - 2 Score 0 0 0 0 0  Altered sleeping 2 0 0 0 0  Tired, decreased energy 0 0 0 0 0  Change in appetite 0 0 0  0  Feeling bad or failure about yourself  0 0 0 0 0  Trouble concentrating 0 0 0 0 0  Moving slowly or fidgety/restless 0 0 0 0 0  Suicidal thoughts 0 0 0 0 0  PHQ-9 Score 2 0 0 0 0  Difficult doing work/chores Not difficult at all Not difficult at all Not difficult at all Not difficult at all     The patient does not have a history of falls. I did complete a risk assessment for falls. A plan of care for falls was documented.   Past Medical History:  Past Medical History:  Diagnosis Date  . Depression   . Hypertension     Surgical History:  Past Surgical History:  Procedure Laterality Date  . BREAST EXCISIONAL BIOPSY Right 2005  . BREAST  SURGERY  2003  . CESAREAN SECTION    . SPINE SURGERY      Medications:  Current Outpatient Medications on File Prior to Visit  Medication Sig  . B Complex-C (SUPER B COMPLEX PO) Take by mouth daily.  . cetirizine (ZYRTEC) 10 MG tablet Take 10 mg by mouth daily.  . Cholecalciferol (VITAMIN D-3) 1000 UNITS CAPS Take 1,000 Units by mouth daily.  . [employer] BIOTIN PO Take by mouth.  . diazepam (VALIUM) 5 MG tablet Take 1 tablet (5 mg total) by mouth every 12 (twelve) hours as needed for anxiety. Muscle cramps  . hydrochlorothiazide (HYDRODIURIL) 12.5 MG tablet Take 1 tablet (12.5 mg total) by mouth daily.  . rizatriptan (MAXALT) 5 MG tablet TAKE 1 TABLET BY MOUTH ONCE AS NEEDED FOR MIGRAINE FOR  UP TO ONE DOSE. MAY TAKE A  SECOND DOSE AFTER 2 HOURS  IF NEEDED.  Marland Kitchen sertraline (ZOLOFT) 50 MG tablet Take 1 tablet (50 mg total) by mouth every morning.   No current facility-administered medications on file prior to visit.    Allergies:  No Known Allergies  Social History:  Social History   Socioeconomic History  . Marital status: Married    Spouse name: Not on file  . Number of children: Not on  file  . Years of education: Not on file  . Highest education level: Not on file  Occupational History  . Not on file  Tobacco Use  . Smoking status: Never  . Smokeless tobacco: Never  Vaping Use  . Vaping Use: Never used  Substance and Sexual Activity  . Alcohol use: Yes    Comment: on occasion  . Drug use: No  . Sexual activity: Yes  Other Topics Concern  . Not on file  Social History Narrative  . Not on file   Social Determinants of Health   Financial Resource Strain: Not on file  Food Insecurity: Not on file  Transportation Needs: Not on file  Physical Activity: Not on file  Stress: Not on file  Social Connections: Not on file  Intimate Partner Violence: Not on file   Social History   Tobacco Use  Smoking Status Never  Smokeless Tobacco Never   Social History   Substance  and Sexual Activity  Alcohol Use Yes   Comment: on occasion    Family History:  Family History  Problem Relation Age of Onset  . Cancer Mother   . Heart disease Father   . Diabetes Sister   . Breast cancer Paternal Aunt   . Stroke Maternal Grandmother   . Heart disease Maternal Grandfather   . Heart disease Paternal Grandmother   . Congestive Heart Failure Paternal Grandmother   . Heart disease Paternal Grandfather   . Heart attack Paternal Grandfather     Past medical history, surgical history, medications, allergies, family history and social history reviewed with patient today and changes made to appropriate areas of the chart.   Review of Systems  Eyes:  Negative for blurred vision and double vision.  Respiratory:  Negative for shortness of breath.   Cardiovascular:  Negative for chest pain, palpitations and leg swelling.  Neurological:  Negative for dizziness and headaches.  All other ROS negative except what is listed above and in the HPI.      Objective:    There were no vitals taken for this visit.  Wt Readings from Last 3 Encounters:  05/24/22 132 lb 3.2 oz (60 kg)  10/30/21 133 lb 6.4 oz (60.5 kg)  04/27/21 128 lb (58.1 kg)    Physical Exam Vitals and nursing note reviewed.  Constitutional:      General: She is awake. She is not in acute distress.    Appearance: She is well-developed and normal weight. She is not ill-appearing.  HENT:     Head: Normocephalic and atraumatic.     Right Ear: Hearing, tympanic membrane, ear canal and external ear normal. No drainage.     Left Ear: Hearing, tympanic membrane, ear canal and external ear normal. No drainage.     Nose: Nose normal.     Right Sinus: No maxillary sinus tenderness or frontal sinus tenderness.     Left Sinus: No maxillary sinus tenderness or frontal sinus tenderness.     Mouth/Throat:     Mouth: Mucous membranes are moist.     Pharynx: Oropharynx is clear. Uvula midline. No pharyngeal swelling,  oropharyngeal exudate or posterior oropharyngeal erythema.  Eyes:     General: Lids are normal.        Right eye: No discharge.        Left eye: No discharge.     Extraocular Movements: Extraocular movements intact.     Conjunctiva/sclera: Conjunctivae normal.     Pupils: Pupils are equal, round, and reactive  to light.     Visual Fields: Right eye visual fields normal and left eye visual fields normal.  Neck:     Thyroid: No thyromegaly.     Vascular: No carotid bruit.     Trachea: Trachea normal.  Cardiovascular:     Rate and Rhythm: Normal rate and regular rhythm.     Heart sounds: Normal heart sounds. No murmur heard.    No gallop.  Pulmonary:     Effort: Pulmonary effort is normal. No accessory muscle usage or respiratory distress.     Breath sounds: Normal breath sounds.  Chest:  Breasts:    Right: Normal.     Left: Normal.  Abdominal:     General: Bowel sounds are normal.     Palpations: Abdomen is soft. There is no hepatomegaly or splenomegaly.     Tenderness: There is no abdominal tenderness.  Musculoskeletal:        General: Normal range of motion.     Cervical back: Normal range of motion and neck supple.     Right lower leg: No edema.     Left lower leg: No edema.  Lymphadenopathy:     Head:     Right side of head: No submental, submandibular, tonsillar, preauricular or posterior auricular adenopathy.     Left side of head: No submental, submandibular, tonsillar, preauricular or posterior auricular adenopathy.     Cervical: No cervical adenopathy.     Upper Body:     Right upper body: No supraclavicular, axillary or pectoral adenopathy.     Left upper body: No supraclavicular, axillary or pectoral adenopathy.  Skin:    General: Skin is warm and dry.     Capillary Refill: Capillary refill takes less than 2 seconds.     Findings: No rash.  Neurological:     Mental Status: She is alert and oriented to person, place, and time.     Gait: Gait is intact.     Deep  Tendon Reflexes: Reflexes are normal and symmetric.     Reflex Scores:      Brachioradialis reflexes are 2+ on the right side and 2+ on the left side.      Patellar reflexes are 2+ on the right side and 2+ on the left side. Psychiatric:        Attention and Perception: Attention normal.        Mood and Affect: Mood normal.        Speech: Speech normal.        Behavior: Behavior normal. Behavior is cooperative.        Thought Content: Thought content normal.        Judgment: Judgment normal.    Results for orders placed or performed in visit on 05/30/22  HM COLONOSCOPY  Result Value Ref Range   HM Colonoscopy See Report (in chart) See Report (in chart), Patient Reported      Assessment & Plan:   Problem List Items Addressed This Visit      Cardiovascular and Mediastinum   Hypertension - Primary     Other   Depression   Anxiety     Follow up plan: No follow-ups on file.   LABORATORY TESTING:  - Pap smear: done elsewhere  IMMUNIZATIONS:   - Tdap: Tetanus vaccination status reviewed: last tetanus booster within 10 years. - Influenza: Up to date - Pneumovax: Not applicable - Prevnar: Not applicable - COVID: Up to date - HPV: Not applicable - Shingrix vaccine: Up to  date  SCREENING: -Mammogram: Up to date  - Colonoscopy: Up to date  - Bone Density: Not applicable  -Hearing Test: Not applicable  -Spirometry: Not applicable   PATIENT COUNSELING:   Advised to take 1 mg of folate supplement per day if capable of pregnancy.   Sexuality: Discussed sexually transmitted diseases, partner selection, use of condoms, avoidance of unintended pregnancy  and contraceptive alternatives.   Advised to avoid cigarette smoking.  I discussed with the patient that most people either abstain from alcohol or drink within safe limits (<=14/week and <=4 drinks/occasion for males, <=7/weeks and <= 3 drinks/occasion for females) and that the risk for alcohol disorders and other health  effects rises proportionally with the number of drinks per week and how often a drinker exceeds daily limits.  Discussed cessation/primary prevention of drug use and availability of treatment for abuse.   Diet: Encouraged to adjust caloric intake to maintain  or achieve ideal body weight, to reduce intake of dietary saturated fat and total fat, to limit sodium intake by avoiding high sodium foods and not adding table salt, and to maintain adequate dietary potassium and calcium preferably from fresh fruits, vegetables, and low-fat dairy products.    stressed the importance of regular exercise  Injury prevention: Discussed safety belts, safety helmets, smoke detector, smoking near bedding or upholstery.   Dental health: Discussed importance of regular tooth brushing, flossing, and dental visits.    NEXT PREVENTATIVE PHYSICAL DUE IN 1 YEAR. No follow-ups on file.

## 2022-11-22 ENCOUNTER — Encounter: Payer: Self-pay | Admitting: Nurse Practitioner

## 2022-11-22 ENCOUNTER — Ambulatory Visit (INDEPENDENT_AMBULATORY_CARE_PROVIDER_SITE_OTHER): Payer: 59 | Admitting: Nurse Practitioner

## 2022-11-22 VITALS — BP 112/65 | HR 65 | Temp 97.8°F | Ht 67.8 in | Wt 135.0 lb

## 2022-11-22 DIAGNOSIS — E7800 Pure hypercholesterolemia, unspecified: Secondary | ICD-10-CM | POA: Diagnosis not present

## 2022-11-22 DIAGNOSIS — F325 Major depressive disorder, single episode, in full remission: Secondary | ICD-10-CM | POA: Diagnosis not present

## 2022-11-22 DIAGNOSIS — I10 Essential (primary) hypertension: Secondary | ICD-10-CM | POA: Diagnosis not present

## 2022-11-22 DIAGNOSIS — F419 Anxiety disorder, unspecified: Secondary | ICD-10-CM

## 2022-11-22 DIAGNOSIS — Z0000 Encounter for general adult medical examination without abnormal findings: Secondary | ICD-10-CM

## 2022-11-22 LAB — MICROSCOPIC EXAMINATION
Bacteria, UA: NONE SEEN
RBC, Urine: NONE SEEN /hpf (ref 0–2)

## 2022-11-22 LAB — URINALYSIS, ROUTINE W REFLEX MICROSCOPIC
Bilirubin, UA: NEGATIVE
Glucose, UA: NEGATIVE
Ketones, UA: NEGATIVE
Nitrite, UA: NEGATIVE
Protein,UA: NEGATIVE
RBC, UA: NEGATIVE
Specific Gravity, UA: 1.01 (ref 1.005–1.030)
Urobilinogen, Ur: 0.2 mg/dL (ref 0.2–1.0)
pH, UA: 7 (ref 5.0–7.5)

## 2022-11-22 MED ORDER — HYDROCHLOROTHIAZIDE 12.5 MG PO TABS: 12.5000 mg | ORAL_TABLET | Freq: Every day | ORAL | 1 refills | Status: DC

## 2022-11-22 MED ORDER — SERTRALINE HCL 50 MG PO TABS: 50.0000 mg | ORAL_TABLET | Freq: Every morning | ORAL | 1 refills | Status: DC

## 2022-11-22 MED ORDER — RIZATRIPTAN BENZOATE 5 MG PO TABS: ORAL_TABLET | ORAL | 2 refills | Status: DC

## 2022-11-22 NOTE — Assessment & Plan Note (Signed)
Chronic.  Controlled.  Continue with current medication regimen on HCTZ 12.'5mg'$ .  Refill sent today.  Labs ordered today.  Return to clinic in 6 months for reevaluation.  Call sooner if concerns arise.

## 2022-11-22 NOTE — Assessment & Plan Note (Signed)
Chronic.  Controlled.  Continue with current medication regimen on Zoloft '50mg'$  daily.  Refill sent today.  Labs ordered today.  Return to clinic in 6 months for reevaluation.  Call sooner if concerns arise.

## 2022-11-22 NOTE — Assessment & Plan Note (Signed)
Chronic.  Controlled without medication. Labs ordered today.  Return to clinic in 6 months for reevaluation.  Call sooner if concerns arise. Reviewed ASCVD risk score reviewed during visit today.

## 2022-11-23 LAB — LIPID PANEL
Chol/HDL Ratio: 3.7 ratio (ref 0.0–4.4)
Cholesterol, Total: 233 mg/dL — ABNORMAL HIGH (ref 100–199)
HDL: 63 mg/dL (ref >39)
LDL Chol Calc (NIH): 155 mg/dL — ABNORMAL HIGH (ref 0–99)
Triglycerides: 87 mg/dL (ref 0–149)
VLDL Cholesterol Cal: 15 mg/dL (ref 5–40)

## 2022-11-23 LAB — CBC WITH DIFFERENTIAL/PLATELET
Basophils Absolute: 0 10*3/uL (ref 0.0–0.2)
Basos: 1 %
EOS (ABSOLUTE): 0.2 10*3/uL (ref 0.0–0.4)
Eos: 2 %
Hematocrit: 37.4 % (ref 34.0–46.6)
Hemoglobin: 12.6 g/dL (ref 11.1–15.9)
Immature Grans (Abs): 0 10*3/uL (ref 0.0–0.1)
Immature Granulocytes: 0 %
Lymphocytes Absolute: 1.7 10*3/uL (ref 0.7–3.1)
Lymphs: 24 %
MCH: 31.9 pg (ref 26.6–33.0)
MCHC: 33.7 g/dL (ref 31.5–35.7)
MCV: 95 fL (ref 79–97)
Monocytes Absolute: 0.5 10*3/uL (ref 0.1–0.9)
Monocytes: 8 %
Neutrophils Absolute: 4.5 10*3/uL (ref 1.4–7.0)
Neutrophils: 65 %
Platelets: 207 10*3/uL (ref 150–450)
RBC: 3.95 x10E6/uL (ref 3.77–5.28)
RDW: 11.9 % (ref 11.7–15.4)
WBC: 6.8 10*3/uL (ref 3.4–10.8)

## 2022-11-23 LAB — COMPREHENSIVE METABOLIC PANEL
ALT: 24 IU/L (ref 0–32)
AST: 24 IU/L (ref 0–40)
Albumin/Globulin Ratio: 2.1 (ref 1.2–2.2)
Albumin: 4.7 g/dL (ref 3.8–4.9)
Alkaline Phosphatase: 72 IU/L (ref 44–121)
BUN/Creatinine Ratio: 15 (ref 12–28)
BUN: 11 mg/dL (ref 8–27)
Bilirubin Total: 0.5 mg/dL (ref 0.0–1.2)
CO2: 26 mmol/L (ref 20–29)
Calcium: 9.8 mg/dL (ref 8.7–10.3)
Chloride: 100 mmol/L (ref 96–106)
Creatinine, Ser: 0.73 mg/dL (ref 0.57–1.00)
Globulin, Total: 2.2 g/dL (ref 1.5–4.5)
Glucose: 104 mg/dL — ABNORMAL HIGH (ref 70–99)
Potassium: 4.2 mmol/L (ref 3.5–5.2)
Sodium: 140 mmol/L (ref 134–144)
Total Protein: 6.9 g/dL (ref 6.0–8.5)
eGFR: 94 mL/min/{1.73_m2} (ref >59)

## 2022-11-23 LAB — TSH: TSH: 1.99 u[IU]/mL (ref 0.450–4.500)

## 2022-11-23 NOTE — Progress Notes (Signed)
Hi Sierra Curtis. It was nice to see you yesterday.  Your lab work looks good.  Your Cardiac risk score remains in the low risk range.  I do recommend a low fat diet.  No concerns at this time. Continue with your current medication regimen.  Follow up as discussed.  Please let me know if you have any questions.   The 10-year ASCVD risk score (Arnett DK, et al., 2019) is: 3.5%   Values used to calculate the score:     Age: 62 years     Sex: Female     Is Non-Hispanic African American: No     Diabetic: No     Tobacco smoker: No     Systolic Blood Pressure: [id number] mmHg     Is BP treated: Yes     HDL Cholesterol: 63 mg/dL     Total Cholesterol: 233 mg/dL

## 2022-12-19 ENCOUNTER — Other Ambulatory Visit: Payer: Self-pay

## 2022-12-19 DIAGNOSIS — Z1231 Encounter for screening mammogram for malignant neoplasm of breast: Secondary | ICD-10-CM

## 2023-01-22 DIAGNOSIS — N8110 Cystocele, unspecified: Secondary | ICD-10-CM | POA: Diagnosis not present

## 2023-01-22 DIAGNOSIS — Z01419 Encounter for gynecological examination (general) (routine) without abnormal findings: Secondary | ICD-10-CM | POA: Diagnosis not present

## 2023-01-22 DIAGNOSIS — N952 Postmenopausal atrophic vaginitis: Secondary | ICD-10-CM | POA: Diagnosis not present

## 2023-01-31 ENCOUNTER — Ambulatory Visit
Admission: RE | Admit: 2023-01-31 | Discharge: 2023-01-31 | Disposition: A | Discharge status: Home / Self Care | Payer: 59 | Source: Ambulatory Visit

## 2023-01-31 DIAGNOSIS — Z1231 Encounter for screening mammogram for malignant neoplasm of breast: Secondary | ICD-10-CM | POA: Insufficient documentation

## 2023-02-04 ENCOUNTER — Other Ambulatory Visit: Payer: Self-pay

## 2023-02-04 ENCOUNTER — Ambulatory Visit: Payer: 59 | Attending: Obstetrics and Gynecology | Admitting: Physical Therapy

## 2023-02-04 DIAGNOSIS — R279 Unspecified lack of coordination: Secondary | ICD-10-CM | POA: Diagnosis not present

## 2023-02-04 DIAGNOSIS — M62838 Other muscle spasm: Secondary | ICD-10-CM | POA: Diagnosis not present

## 2023-02-04 DIAGNOSIS — M6281 Muscle weakness (generalized): Secondary | ICD-10-CM | POA: Diagnosis not present

## 2023-02-04 NOTE — Therapy (Signed)
OUTPATIENT PHYSICAL THERAPY FEMALE PELVIC EVALUATION   Patient Name: Sierra Curtis MRN: [id number] DOB:1960-12-06, 62 y.o., female Today's Date: 02/05/2023  END OF SESSION:  PT End of Session - 02/04/23 1235     Visit Number 1    Date for PT Re-Evaluation 04/29/23    Authorization Type [employer]    PT Start Time 1235    PT Stop Time 1314    PT Time Calculation (min) 39 min    Activity Tolerance Patient tolerated treatment well    Behavior During Therapy [facility] for tasks assessed/performed             Past Medical History:  Diagnosis Date   Anxiety 2006   No longer an issue   Depression    Hypertension    Past Surgical History:  Procedure Laterality Date   BREAST EXCISIONAL BIOPSY Right 2005   BREAST SURGERY  2003   CESAREAN SECTION     SPINE SURGERY     Patient Active Problem List   Diagnosis Date Noted   Hypercholesterolemia 11/21/2022   Migraine 06/09/2019   Anxiety 11/05/2018   Menopause 11/05/2018   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  Pt tried moisturizers and that didn't help.  Pt does gardening regularly.  Pt retired 2 years ago.  Fluid intake: Yes: a lot of water    PAIN:  Are you having pain? No  PRECAUTIONS: None  WEIGHT BEARING RESTRICTIONS: No  FALLS:  Has patient fallen in last 6 months? No  LIVING ENVIRONMENT: Lives with:  lives with their spouse Lives in: House/apartment   OCCUPATION: retired  PLOF: Independent  PATIENT GOALS: intercourse without pain and not leaking  PERTINENT HISTORY:  VBAC and c-section Sexual abuse: No  BOWEL MOVEMENT: Pain with bowel movement: No Type of bowel movement:Strain No Fully empty rectum: Yes:   Leakage: No Pads: No Fiber supplement: No  URINATION: Pain with urination: No Fully empty bladder: Yes:   Stream: Strong Urgency: No Frequency: every 2 hours Leakage: Urge to  void and Walking to the bathroom Pads: No  INTERCOURSE: Pain with intercourse: During Penetration Ability to have vaginal penetration:  Yes: but have to be very careful Climax: yes Marinoff Scale: 3/3  PREGNANCY: Vaginal deliveries 1 vbac Tearing Yes: maybe not sure - had to use forceps but don't remember it being tearing C-section deliveries 1 Currently pregnant No  PROLAPSE: Cystocele     OBJECTIVE:    PATIENT SURVEYS:    PFIQ-7   COGNITION: Overall cognitive status: Within functional limits for tasks assessed     SENSATION: Light touch:  Proprioception: appears intact  MUSCLE LENGTH: Hamstrings: Right 80 deg; Left 80 deg Thomas test:   LUMBAR SPECIAL TESTS:  Single leg stance test: Negative and ASLR positive with copression  FUNCTIONAL TESTS:    GAIT:  Comments: WFL   POSTURE: rounded shoulders, increased lumbar lordosis, and increased thoracic kyphosis  PELVIC ALIGNMENT:  LUMBARAROM/PROM:  A/PROM A/PROM  eval  Flexion 75%  Extension   Right lateral flexion   Left lateral flexion   Right rotation   Left rotation    (Blank rows = not tested)  LOWER EXTREMITY ROM:  Passive ROM Right eval Left eval  Hip flexion 75% 75%  Hip extension    Hip abduction    Hip adduction    Hip internal rotation 75% 75%  Hip external rotation 75% 75%  Knee flexion    Knee extension    Ankle dorsiflexion    Ankle plantarflexion    Ankle inversion    Ankle  eversion     (Blank rows = not tested)  LOWER EXTREMITY MMT:  Not tested at Eval PALPATION:   General  tight throughout posterior kinetic chain                External Perineal Exam normal                             Internal Pelvic Floor high tone levator Rt>Lt  Patient confirms identification and approves PT to assess internal pelvic floor and treatment Yes  PELVIC MMT:   MMT eval  Vaginal 3-4/5 for 1 rep, can hold 15 sec 3/5  Internal Anal Sphincter   External Anal Sphincter   Puborectalis   Diastasis Recti   (Blank rows = not tested)        TONE: high  PROLAPSE: Anterior wall not past introitus  TODAY'S TREATMENT:                                                                                                                              DATE: 02/04/23  EVAL , urgency techniques and initial HEP demo and reviewed with patient to explain how to do, pt familiar with exercises so did not have her perform   PATIENT EDUCATION:  Education details: [email or web address] and urge techniques Person educated: Patient Education method: Explanation, Demonstration, Verbal cues, and Handouts Education comprehension:  verbalized understanding  HOME EXERCISE PROGRAM: Access Code: [email or web address] URL: [email or web address] Date: 02/05/2023 Prepared by: Dwana Curd  Exercises - Supine Diaphragmatic Breathing  - 3 x daily - 7 x weekly - 1 sets - 10 reps - Hooklying Hamstring Stretch with Doorway  - 1 x daily - 7 x weekly - 3 sets - 1 reps - 30 hold - Supine Figure 4 Piriformis Stretch  - 1 x daily - 7 x weekly - 3 sets - 10 reps - Happy Baby with Pelvic Floor Lengthening  - 1 x daily - 7 x weekly - 1 sets - 3 reps - 30 hold  ASSESSMENT:  CLINICAL IMPRESSION: Patient is a 62 y.o. female who was seen today for physical therapy evaluation and treatment for POP. Pt has pelvic floor strength of 3+/5 and can sustain contraction for 15 sec at a time.  Pt has a harder time relaxing.   They also have tension throughout posterior kinetic chain and muscle tension limiting mobility.  Pt will benefit from muscle lengthening as well as addressing all above mentioned impairments so she can improve muscle function and reduce pain and leakage/urgency of the bladder.  OBJECTIVE IMPAIRMENTS: decreased coordination, decreased endurance, decreased ROM, decreased strength, increased muscle spasms, impaired flexibility, impaired tone, postural dysfunction, and pain.   ACTIVITY LIMITATIONS: continence and toileting  PARTICIPATION LIMITATIONS: interpersonal relationship  PERSONAL FACTORS: Time since onset of injury/illness/exacerbation and 1-2 comorbidities: c-section and VBAC in history  are also affecting patient's functional outcome.   REHAB POTENTIAL: Excellent  CLINICAL DECISION MAKING: Evolving/moderate complexity  EVALUATION COMPLEXITY: Moderate   GOALS: Goals reviewed with patient? Yes  SHORT TERM GOALS: Target date: 03/04/23  Ind with urgency techniques Baseline: Goal status: INITIAL  2.  Ind with initial HEP Baseline:  Goal status: INITIAL    LONG TERM GOALS: Target date: 04/29/23  Pt will be independent with advanced HEP to maintain improvements made throughout therapy  Baseline:  Goal status: INITIAL  2.  Pt will have 0-1/3 score of Marinoff scale  Baseline:  Goal status: INITIAL  3.  Pt will have 75% less urgency due to bladder retraining and strengthening  Baseline:  Goal status: INITIAL  4.  Pt will be able to exercise and garden without increased symptoms Baseline:  Goal status: INITIAL  5.  Pt will be able to perform 5 quick flicks for bladder control due to being able to more quickly relax the pelvic floor Baseline:  Goal status: INITIAL    PLAN:  PT FREQUENCY: 1x/week  PT DURATION: 12 weeks  PLANNED INTERVENTIONS: Therapeutic exercises, Therapeutic activity, Neuromuscular re-education, Balance training, Gait training, Patient/Family  education, Self Care, Joint mobilization, Dry Needling, Electrical stimulation, Cryotherapy, Moist heat, Taping, Traction, Biofeedback, Manual therapy, and Re-evaluation  PLAN FOR NEXT SESSION: stretch and relax pelvic floor with breathing and internal STM, lumbar and gluteal stretches and maybe STM/dry needling    Brayton Caves Jaelen Soth, PT 02/05/2023, 7:45 AM

## 2023-02-20 ENCOUNTER — Ambulatory Visit: Payer: 59 | Admitting: Physical Therapy

## 2023-02-20 DIAGNOSIS — M62838 Other muscle spasm: Secondary | ICD-10-CM | POA: Diagnosis not present

## 2023-02-20 DIAGNOSIS — M6281 Muscle weakness (generalized): Secondary | ICD-10-CM | POA: Diagnosis not present

## 2023-02-20 DIAGNOSIS — R279 Unspecified lack of coordination: Secondary | ICD-10-CM

## 2023-02-20 NOTE — Therapy (Signed)
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  Rationale for Evaluation and Treatment: Rehabilitation  ONSET DATE: 6 months  SUBJECTIVE:                                                                                                                                                                                           SUBJECTIVE STATEMENT: Pt able to work on urgency and no issues with that the past two weeks. Discussed dry needling and pt was on board with that if it is recommended  Fluid intake: Yes: a lot of water    PAIN:  Are you having pain? No  PRECAUTIONS: None  WEIGHT BEARING RESTRICTIONS: No  FALLS:  Has patient fallen in last 6 months? No  LIVING ENVIRONMENT: Lives with: lives with their spouse Lives in: House/apartment   OCCUPATION: retired  PLOF: Independent  PATIENT GOALS: intercourse without pain and not leaking  PERTINENT HISTORY:  VBAC and c-section Sexual abuse: No  BOWEL MOVEMENT: Pain with bowel movement: No Type of bowel movement:Strain No Fully empty rectum: Yes:   Leakage: No Pads: No Fiber supplement: No  URINATION: Pain with  urination: No Fully empty bladder: Yes:   Stream: Strong Urgency: No Frequency: every 2 hours Leakage: Urge to void and Walking to the bathroom Pads: No  INTERCOURSE: Pain with intercourse: During Penetration Ability to have vaginal penetration:  Yes: but have to be very careful Climax: yes Marinoff Scale: 3/3  PREGNANCY: Vaginal deliveries 1 vbac Tearing Yes: maybe not sure - had to use forceps but don't remember it being tearing C-section deliveries 1 Currently pregnant No  PROLAPSE: Cystocele     OBJECTIVE:    PATIENT SURVEYS:    PFIQ-7   COGNITION: Overall cognitive status:  Within functional limits for tasks assessed     SENSATION: Light touch:  Proprioception: appears intact  MUSCLE LENGTH: Hamstrings: Right 80 deg; Left 80 deg Thomas test:   LUMBAR SPECIAL TESTS:  Single leg stance test: Negative and ASLR positive with copression  FUNCTIONAL TESTS:    GAIT:  Comments: WFL   POSTURE: rounded shoulders, increased lumbar lordosis, and increased thoracic kyphosis  PELVIC ALIGNMENT:  LUMBARAROM/PROM:  A/PROM A/PROM  eval  Flexion 75%  Extension   Right lateral flexion   Left lateral flexion   Right rotation   Left rotation    (Blank rows = not tested)  LOWER EXTREMITY ROM:  Passive ROM Right eval Left eval  Hip flexion 75% 75%  Hip extension    Hip abduction    Hip adduction    Hip internal rotation 75% 75%  Hip external rotation 75% 75%  Knee flexion    Knee extension    Ankle dorsiflexion    Ankle plantarflexion    Ankle inversion    Ankle eversion     (Blank rows = not tested)  LOWER EXTREMITY MMT:  Not tested at Eval PALPATION:   General  tight throughout posterior kinetic chain                External Perineal Exam normal                             Internal Pelvic Floor high tone levator Rt>Lt  Patient confirms identification and approves PT to assess internal pelvic floor and treatment Yes  PELVIC MMT:   MMT  eval  Vaginal 3-4/5 for 1 rep, can hold 15 sec 3/5  Internal Anal Sphincter   External Anal Sphincter   Puborectalis   Diastasis Recti   (Blank rows = not tested)        TONE: high  PROLAPSE: Anterior wall not past introitus  TODAY'S TREATMENT:                                                                                                                              DATE: 02/20/23  Manual: Lumbar paraspinals with trigger point release Trigger Point Dry-Needling  Treatment instructions: Expect mild to moderate muscle soreness. S/S of pneumothorax if dry needled over a lung field, and to seek immediate medical attention should they occur. Patient verbalized understanding of these instructions and education.  Patient Consent Given: Yes Education handout provided: No - verbal explain and consent given, added to medbridge app for pt to review Muscles treated: lumbar paraspinals and multifidi Electrical stimulation performed: No Parameters: N/A Treatment response/outcome: increased soft tissue pliability  Theract: Explain and handout on dilators and samples of lubricants  Exercise: Child pose with side bending   PATIENT EDUCATION:  Education details: [id number] and urge techniques Person educated: Patient Education method: Explanation, Demonstration, Verbal cues, and Handouts Education comprehension: verbalized understanding  HOME EXERCISE PROGRAM:  [id number]  ASSESSMENT:  CLINICAL IMPRESSION: Pt had good muscle release with manual and trigger point. Pt given stretch and reviewed HEP to continue to work on muscle length.  Pt was able to understand reason for dilators and agreeable to obtaining for improved pelvic floor muscle length.  Pt will benefit from skilled PT and to ensure pt is ind with dilators for return to pain free intercourse.  OBJECTIVE IMPAIRMENTS: decreased coordination, decreased endurance, decreased ROM, decreased strength, increased muscle spasms, impaired  flexibility, impaired tone, postural dysfunction, and pain.   ACTIVITY LIMITATIONS: continence and toileting  PARTICIPATION LIMITATIONS: interpersonal relationship  PERSONAL FACTORS: Time since onset of injury/illness/exacerbation and 1-2 comorbidities: c-section and VBAC in history  are also affecting patient's functional outcome.   REHAB POTENTIAL: Excellent  CLINICAL DECISION MAKING: Evolving/moderate complexity  EVALUATION COMPLEXITY: Moderate   GOALS: Goals reviewed with patient? Yes  SHORT TERM GOALS: Target date: 03/04/23  Ind with urgency techniques Baseline: Goal status: MET  2.  Ind with initial HEP Baseline:  Goal status: MET    LONG TERM GOALS: Target date: 04/29/23  Pt will be independent with advanced HEP to maintain improvements made throughout therapy  Baseline:  Goal status: INITIAL  2.  Pt will have 0-1/3 score of Marinoff scale  Baseline:  Goal status: INITIAL  3.  Pt will have 75% less urgency due to bladder retraining and strengthening  Baseline:  Goal status: INITIAL  4.  Pt will be able to exercise and garden without increased symptoms Baseline:  Goal status: INITIAL  5.  Pt will be able to perform 5 quick flicks for bladder control due to being able to more quickly relax the pelvic floor Baseline:  Goal status: INITIAL    PLAN:  PT FREQUENCY: 1x/week  PT DURATION: 12 weeks  PLANNED INTERVENTIONS: Therapeutic exercises, Therapeutic activity, Neuromuscular re-education, Balance training, Gait training, Patient/Family education, Self Care, Joint mobilization, Dry Needling, Electrical stimulation, Cryotherapy, Moist heat, Taping, Traction, Biofeedback, Manual therapy, and Re-evaluation  PLAN FOR NEXT SESSION: stretch and relax pelvic floor with breathing and STM/dry needling lumbar as needed; f/u on dilators, core strength with breathing   Brayton Caves Ahlana Slaydon, PT 02/20/2023, 12:15 PM

## 2023-02-27 ENCOUNTER — Ambulatory Visit: Payer: 59 | Attending: Obstetrics and Gynecology | Admitting: Physical Therapy

## 2023-02-27 ENCOUNTER — Encounter: Payer: Self-pay | Admitting: Physical Therapy

## 2023-02-27 DIAGNOSIS — M6281 Muscle weakness (generalized): Secondary | ICD-10-CM | POA: Insufficient documentation

## 2023-02-27 DIAGNOSIS — R279 Unspecified lack of coordination: Secondary | ICD-10-CM | POA: Insufficient documentation

## 2023-02-27 DIAGNOSIS — M62838 Other muscle spasm: Secondary | ICD-10-CM | POA: Insufficient documentation

## 2023-02-27 NOTE — Therapy (Signed)
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  Rationale for Evaluation and Treatment: Rehabilitation  ONSET DATE: 6 months  SUBJECTIVE:                                                                                                                                                                                           SUBJECTIVE STATEMENT: Pt able to work on urgency and no issues with that the past two weeks. Discussed dry needling and pt was on board with that if it is recommended  Fluid intake: Yes: a lot of water    PAIN:  Are you having pain? No  PRECAUTIONS: None  WEIGHT BEARING RESTRICTIONS: No  FALLS:  Has patient fallen in last 6 months? No  LIVING ENVIRONMENT: Lives with: lives with their spouse Lives in: House/apartment   OCCUPATION: retired  PLOF: Independent  PATIENT GOALS: intercourse without pain and not leaking  PERTINENT HISTORY:  VBAC and c-section Sexual abuse: No  BOWEL MOVEMENT: Pain with bowel movement: No Type of bowel movement:Strain No Fully empty rectum: Yes:   Leakage: No Pads: No Fiber supplement: No  URINATION: Pain with  urination: No Fully empty bladder: Yes:   Stream: Strong Urgency: No Frequency: every 2 hours Leakage: Urge to void and Walking to the bathroom Pads: No  INTERCOURSE: Pain with intercourse: During Penetration Ability to have vaginal penetration:  Yes: but have to be very careful Climax: yes Marinoff Scale: 3/3  PREGNANCY: Vaginal deliveries 1 vbac Tearing Yes: maybe not sure - had to use forceps but don't remember it being tearing C-section deliveries 1 Currently pregnant No  PROLAPSE: Cystocele     OBJECTIVE:    PATIENT SURVEYS:    PFIQ-7   COGNITION: Overall cognitive status:  Within functional limits for tasks assessed     SENSATION: Light touch:  Proprioception: appears intact  MUSCLE LENGTH: Hamstrings: Right 80 deg; Left 80 deg Thomas test:   LUMBAR SPECIAL TESTS:  Single leg stance test: Negative and ASLR positive with copression  FUNCTIONAL TESTS:    GAIT:  Comments: WFL   POSTURE: rounded shoulders, increased lumbar lordosis, and increased thoracic kyphosis  PELVIC ALIGNMENT:  LUMBARAROM/PROM:  A/PROM A/PROM  eval  Flexion 75%  Extension   Right lateral flexion   Left lateral flexion   Right rotation   Left rotation    (Blank rows = not tested)  LOWER EXTREMITY ROM:  Passive ROM Right eval Left eval  Hip flexion 75% 75%  Hip extension    Hip abduction    Hip adduction    Hip internal rotation 75% 75%  Hip external rotation 75% 75%  Knee flexion    Knee extension    Ankle dorsiflexion    Ankle plantarflexion    Ankle inversion    Ankle eversion     (Blank rows = not tested)  LOWER EXTREMITY MMT:  Not tested at Eval PALPATION:   General  tight throughout posterior kinetic chain                External Perineal Exam normal                             Internal Pelvic Floor high tone levator Rt>Lt  Patient confirms identification and approves PT to assess internal pelvic floor and treatment Yes  PELVIC MMT:   MMT  eval  Vaginal 3-4/5 for 1 rep, can hold 15 sec 3/5  Internal Anal Sphincter   External Anal Sphincter   Puborectalis   Diastasis Recti   (Blank rows = not tested)        TONE: high  PROLAPSE: Anterior wall not past introitus  TODAY'S TREATMENT:                                                                                                                              DATE: 02/27/23   Exercise: Thoracic side bend on peanut Primal press up 10x 3 sec hold Supine hollow holds, add UE, add LE tapping - 20x each way Modified dead lift Reviewed HEP - no questions everything seems good and is able to access on app    PATIENT EDUCATION:  Education details: [id number] and urge techniques Person educated: Patient Education method: Explanation, Demonstration, Verbal cues, and Handouts Education comprehension: verbalized understanding  HOME EXERCISE PROGRAM: [id number]  ASSESSMENT:  CLINICAL IMPRESSION: Pt did well with exercises today.  Pt was able to do moderate to highly challenging core exercises and added to HEP. Pt just got dilators and is confident she can use them.  Pt has met goals for urgency and leakage and not having any issues with that.  Pt will benefit from  skilled PT to continue to ensure maximum activity level without prolapse and able to progress dilators for return to intimacy with her partner.  OBJECTIVE IMPAIRMENTS: decreased coordination, decreased endurance, decreased ROM, decreased strength, increased muscle spasms, impaired flexibility, impaired tone, postural dysfunction, and pain.   ACTIVITY LIMITATIONS: continence and toileting  PARTICIPATION LIMITATIONS: interpersonal relationship  PERSONAL FACTORS: Time since onset of injury/illness/exacerbation and 1-2 comorbidities: c-section and VBAC in history  are also affecting patient's functional outcome.   REHAB POTENTIAL: Excellent  CLINICAL DECISION MAKING: Evolving/moderate complexity  EVALUATION COMPLEXITY:  Moderate   GOALS: Goals reviewed with patient? Yes  SHORT TERM GOALS: Target date: 03/04/23  Ind with urgency techniques Baseline: Goal status: MET  2.  Ind with initial HEP Baseline:  Goal status: MET    LONG TERM GOALS: Target date: 04/29/23  Pt will be independent with advanced HEP to maintain improvements made throughout therapy  Baseline:  Goal status: INITIAL  2.  Pt will have 0-1/3 score of Marinoff scale  Baseline:  Goal status: INITIAL  3.  Pt will have 75% less urgency due to bladder retraining and strengthening  Baseline: not an issue Goal status: MET  4.  Pt will be able to exercise and garden without increased symptoms Baseline: I did a lot of gardening and did not feel anything Goal status: INITIAL  5.  Pt will be able to perform 5 quick flicks for bladder control due to being able to more quickly relax the pelvic floor Baseline: not having any difficulty Goal status: MET    PLAN:  PT FREQUENCY: 1x/week  PT DURATION: 12 weeks  PLANNED INTERVENTIONS: Therapeutic exercises, Therapeutic activity, Neuromuscular re-education, Balance training, Gait training, Patient/Family education, Self Care, Joint mobilization, Dry Needling, Electrical stimulation, Cryotherapy, Moist heat, Taping, Traction, Biofeedback, Manual therapy, and Re-evaluation  PLAN FOR NEXT SESSION: f/u with dilators core strength with breathing and half kneel, squat, dead lift progressions   Junious Silk, PT 02/27/2023, 12:43 PM

## 2023-03-07 ENCOUNTER — Encounter: Payer: 59 | Admitting: Physical Therapy

## 2023-03-07 DIAGNOSIS — L578 Other skin changes due to chronic exposure to nonionizing radiation: Secondary | ICD-10-CM | POA: Diagnosis not present

## 2023-03-07 DIAGNOSIS — D2272 Melanocytic nevi of left lower limb, including hip: Secondary | ICD-10-CM | POA: Diagnosis not present

## 2023-03-07 DIAGNOSIS — D2262 Melanocytic nevi of left upper limb, including shoulder: Secondary | ICD-10-CM | POA: Diagnosis not present

## 2023-03-07 DIAGNOSIS — D2271 Melanocytic nevi of right lower limb, including hip: Secondary | ICD-10-CM | POA: Diagnosis not present

## 2023-03-07 DIAGNOSIS — L400 Psoriasis vulgaris: Secondary | ICD-10-CM | POA: Diagnosis not present

## 2023-03-07 DIAGNOSIS — D2261 Melanocytic nevi of right upper limb, including shoulder: Secondary | ICD-10-CM | POA: Diagnosis not present

## 2023-03-07 DIAGNOSIS — D224 Melanocytic nevi of scalp and neck: Secondary | ICD-10-CM | POA: Diagnosis not present

## 2023-03-07 DIAGNOSIS — L814 Other melanin hyperpigmentation: Secondary | ICD-10-CM | POA: Diagnosis not present

## 2023-03-07 DIAGNOSIS — D225 Melanocytic nevi of trunk: Secondary | ICD-10-CM | POA: Diagnosis not present

## 2023-03-17 NOTE — Therapy (Unsigned)
OUTPATIENT PHYSICAL THERAPY FEMALE PELVIC TREATMENT   Patient Name: Sierra Curtis MRN: [id number] DOB:1961/02/02, 62 y.o., female Today's Date: 03/18/2023  END OF SESSION:  PT End of Session - 03/18/23 0931     Visit Number 4    Date for PT Re-Evaluation 04/29/23    Authorization Type [employer]    PT Start Time [phone]    PT Stop Time 1012    PT Time Calculation (min) 41 min    Activity Tolerance Patient tolerated treatment well    Behavior During Therapy [facility] for tasks assessed/performed              Past Medical History:  Diagnosis Date   Anxiety 2006   No longer an issue   Depression    Hypertension    Past Surgical History:  Procedure Laterality Date   BREAST EXCISIONAL BIOPSY Right 2005   BREAST SURGERY  2003   CESAREAN SECTION     SPINE SURGERY     Patient Active Problem List   Diagnosis Date Noted   Hypercholesterolemia 11/21/2022   Migraine 06/09/2019   Anxiety 11/05/2018   Menopause 11/05/2018   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   Depression    Hypertension     PCP: Larae Grooms, NP   REFERRING PROVIDER: Julien Girt Pi'Ilani, FNP   REFERRING DIAG: N81.10 (ICD-10-CM) - Cystocele, unspecified  THERAPY DIAG:  Muscle weakness (generalized)  Other muscle spasm  Unspecified lack of coordination  Rationale for Evaluation and Treatment: Rehabilitation  ONSET DATE: 6 months  SUBJECTIVE:                                                                                                                                                                                           SUBJECTIVE STATEMENT: Pt has been feeling good and no leakage this week.  Soreness this week from riding the bike more.  Pt has been away the beach and no complaints. No pain with intercourse.  Fluid intake: Yes: a lot of water    PAIN:  Are you having pain? No  PRECAUTIONS: None  WEIGHT BEARING RESTRICTIONS: No  FALLS:  Has patient fallen in last 6 months?  No  LIVING ENVIRONMENT: Lives with: lives with their spouse Lives in: House/apartment   OCCUPATION: retired  PLOF: Independent  PATIENT GOALS: intercourse without pain and not leaking  PERTINENT HISTORY:  VBAC and c-section Sexual abuse: No  BOWEL MOVEMENT: Pain with bowel movement: No Type of bowel movement:Strain No Fully empty rectum: Yes:   Leakage: No Pads: No Fiber supplement: No  URINATION: Pain with urination: No Fully empty bladder: Yes:   Stream:  Strong Urgency: No Frequency: every 2 hours Leakage: Urge to void and Walking to the bathroom Pads: No  INTERCOURSE: Pain with intercourse: During Penetration Ability to have vaginal penetration:  Yes: but have to be very careful Climax: yes Marinoff Scale: 3/3  PREGNANCY: Vaginal deliveries 1 vbac Tearing Yes: maybe not sure - had to use forceps but don't remember it being tearing C-section deliveries 1 Currently pregnant No  PROLAPSE: Cystocele     OBJECTIVE:    PATIENT SURVEYS:    PFIQ-7   COGNITION: Overall cognitive status: Within functional limits for tasks assessed     SENSATION: Light touch:  Proprioception: appears intact  MUSCLE LENGTH: Hamstrings: Right 80 deg; Left 80 deg Thomas test:   LUMBAR SPECIAL TESTS:  Single leg stance test: Negative and ASLR positive with copression  FUNCTIONAL TESTS:    GAIT:  Comments: WFL   POSTURE: rounded shoulders, increased lumbar lordosis, and increased thoracic kyphosis  PELVIC ALIGNMENT:  LUMBARAROM/PROM:  A/PROM A/PROM  eval  Flexion 75%  Extension   Right lateral flexion   Left lateral flexion   Right rotation   Left rotation    (Blank rows = not tested)  LOWER EXTREMITY ROM:  Passive ROM Right eval Left eval  Hip flexion 75% 75%  Hip extension    Hip abduction    Hip adduction    Hip internal rotation 75% 75%  Hip external rotation 75% 75%  Knee flexion    Knee extension    Ankle dorsiflexion    Ankle  plantarflexion    Ankle inversion    Ankle eversion     (Blank rows = not tested)  LOWER EXTREMITY MMT:  Not tested at Eval PALPATION:   General  tight throughout posterior kinetic chain                External Perineal Exam normal                             Internal Pelvic Floor high tone levator Rt>Lt  Patient confirms identification and approves PT to assess internal pelvic floor and treatment Yes  PELVIC MMT:   MMT eval  Vaginal 3-4/5 for 1 rep, can hold 15 sec 3/5  Internal Anal Sphincter   External Anal Sphincter   Puborectalis   Diastasis Recti   (Blank rows = not tested)        TONE: high  PROLAPSE: Anterior wall not past introitus  TODAY'S TREATMENT:                                                                                                                              DATE: 03/18/23   Exercise: Qped UE reach - 20x Qped LE reach- 20x Child pose - 60 sec Standing at wall with post pelvic tilt to neutral - UE lift to 90 dge alternating arms - 6lb weight - 20x Lat pull down -  10lb bil with pulleys Punch 7lb - 15x each side Shoulder ext - 7lb bil - 20x    Manual: Soft tissue fascial release to lumbar in qped    PATIENT EDUCATION:  Education details: [id number] and urge techniques Person educated: Patient Education method: Explanation, Demonstration, Verbal cues, and Handouts Education comprehension: verbalized understanding  HOME EXERCISE PROGRAM: [id number]  ASSESSMENT:  CLINICAL IMPRESSION: Pt did well with exercises today.  Pt reports less leakage and able to be intimate with husband and no pain.  Pt continues to make progress with increased difficulty and adding functional exercises to HEP.  Pt will benefit from skilled PT to continue to ensure maximum activity level without prolapse.  OBJECTIVE IMPAIRMENTS: decreased coordination, decreased endurance, decreased ROM, decreased strength, increased muscle spasms, impaired flexibility, impaired  tone, postural dysfunction, and pain.   ACTIVITY LIMITATIONS: continence and toileting  PARTICIPATION LIMITATIONS: interpersonal relationship  PERSONAL FACTORS: Time since onset of injury/illness/exacerbation and 1-2 comorbidities: c-section and VBAC in history  are also affecting patient's functional outcome.   REHAB POTENTIAL: Excellent  CLINICAL DECISION MAKING: Evolving/moderate complexity  EVALUATION COMPLEXITY: Moderate   GOALS: Goals reviewed with patient? Yes  SHORT TERM GOALS: Target date: 03/04/23  Ind with urgency techniques Baseline: Goal status: MET  2.  Ind with initial HEP Baseline:  Goal status: MET    LONG TERM GOALS: Target date: 04/29/23 Update 03/18/23  Pt will be independent with advanced HEP to maintain improvements made throughout therapy  Baseline:  Goal status: IN PROGRESS  2.  Pt will have 0-1/3 score of Marinoff scale  Baseline: 0/3 lMarinoff this week Goal status: IN PROGRESS  3.  Pt will have 75% less urgency due to bladder retraining and strengthening  Baseline: not an issue Goal status: MET  4.  Pt will be able to exercise and garden without increased symptoms Baseline: no leakage this week Goal status: IN PROGRESS  5.  Pt will be able to perform 5 quick flicks for bladder control due to being able to more quickly relax the pelvic floor Baseline: not having any difficulty Goal status: MET    PLAN:  PT FREQUENCY: 1x/week  PT DURATION: 12 weeks  PLANNED INTERVENTIONS: Therapeutic exercises, Therapeutic activity, Neuromuscular re-education, Balance training, Gait training, Patient/Family education, Self Care, Joint mobilization, Dry Needling, Electrical stimulation, Cryotherapy, Moist heat, Taping, Traction, Biofeedback, Manual therapy, and Re-evaluation  PLAN FOR NEXT SESSION: core strength with breathing and half kneel, squat, dead lift progressions; lumbar fascial release as needed   [employer], PT 03/18/2023, 2:57  PM

## 2023-03-18 ENCOUNTER — Ambulatory Visit: Payer: 59 | Admitting: Physical Therapy

## 2023-03-18 ENCOUNTER — Encounter: Payer: Self-pay | Admitting: Physical Therapy

## 2023-03-18 DIAGNOSIS — M62838 Other muscle spasm: Secondary | ICD-10-CM | POA: Diagnosis not present

## 2023-03-18 DIAGNOSIS — R279 Unspecified lack of coordination: Secondary | ICD-10-CM | POA: Diagnosis not present

## 2023-03-18 DIAGNOSIS — M6281 Muscle weakness (generalized): Secondary | ICD-10-CM | POA: Diagnosis not present

## 2023-03-26 NOTE — Therapy (Signed)
OUTPATIENT PHYSICAL THERAPY FEMALE PELVIC TREATMENT   Patient Name: Sierra Curtis MRN: [id number] DOB:02/25/1961, 62 y.o., female Today's Date: 03/27/2023  END OF SESSION:  PT End of Session - 03/27/23 1107     Visit Number 5    Date for PT Re-Evaluation 04/29/23    Authorization Type [employer]    PT Start Time 1104    PT Stop Time 1144    PT Time Calculation (min) 40 min    Activity Tolerance Patient tolerated treatment well    Behavior During Therapy [facility] for tasks assessed/performed               Past Medical History:  Diagnosis Date   Anxiety 2006   No longer an issue   Depression    Hypertension    Past Surgical History:  Procedure Laterality Date   BREAST EXCISIONAL BIOPSY Right 2005   BREAST SURGERY  2003   CESAREAN SECTION     SPINE SURGERY     Patient Active Problem List   Diagnosis Date Noted   Hypercholesterolemia 11/21/2022   Migraine 06/09/2019   Anxiety 11/05/2018   Menopause 11/05/2018   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   Depression    Hypertension     PCP: Larae Grooms, NP   REFERRING PROVIDER: Julien Girt Pi'Ilani, FNP   REFERRING DIAG: N81.10 (ICD-10-CM) - Cystocele, unspecified  THERAPY DIAG:  Muscle weakness (generalized)  Other muscle spasm  Unspecified lack of coordination  Rationale for Evaluation and Treatment: Rehabilitation  ONSET DATE: 6 months  SUBJECTIVE:                                                                                                                                                                                           SUBJECTIVE STATEMENT: Pt has been feeling good and no leakage this week and no pain.  Fluid intake: Yes: a lot of water    PAIN:  Are you having pain? No  PRECAUTIONS: None  WEIGHT BEARING RESTRICTIONS: No  FALLS:  Has patient fallen in last 6 months? No  LIVING ENVIRONMENT: Lives with: lives with their spouse Lives in: House/apartment   OCCUPATION:  retired  PLOF: Independent  PATIENT GOALS: intercourse without pain and not leaking  PERTINENT HISTORY:  VBAC and c-section Sexual abuse: No  BOWEL MOVEMENT: Pain with bowel movement: No Type of bowel movement:Strain No Fully empty rectum: Yes:   Leakage: No Pads: No Fiber supplement: No  URINATION: Pain with urination: No Fully empty bladder: Yes:   Stream: Strong Urgency: No Frequency: every 2 hours Leakage: Urge to void and Walking to the bathroom Pads: No  INTERCOURSE: Pain with intercourse: During Penetration Ability to have vaginal penetration:  Yes: but have to be very careful Climax: yes Marinoff Scale: 3/3  PREGNANCY: Vaginal deliveries 1 vbac Tearing Yes: maybe not sure - had to use forceps but don't remember it being tearing C-section deliveries 1 Currently pregnant No  PROLAPSE: Cystocele     OBJECTIVE:    PATIENT SURVEYS:    PFIQ-7   COGNITION: Overall cognitive status: Within functional limits for tasks assessed     SENSATION: Light touch:  Proprioception: appears intact  MUSCLE LENGTH: Hamstrings: Right 80 deg; Left 80 deg Thomas test:   LUMBAR SPECIAL TESTS:  Single leg stance test: Negative and ASLR positive with copression  FUNCTIONAL TESTS:    GAIT:  Comments: WFL   POSTURE: rounded shoulders, increased lumbar lordosis, and increased thoracic kyphosis  PELVIC ALIGNMENT:  LUMBARAROM/PROM:  A/PROM A/PROM  eval  Flexion 75%  Extension   Right lateral flexion   Left lateral flexion   Right rotation   Left rotation    (Blank rows = not tested)  LOWER EXTREMITY ROM:  Passive ROM Right eval Left eval  Hip flexion 75% 75%  Hip extension    Hip abduction    Hip adduction    Hip internal rotation 75% 75%  Hip external rotation 75% 75%  Knee flexion    Knee extension    Ankle dorsiflexion    Ankle plantarflexion    Ankle inversion    Ankle eversion     (Blank rows = not tested)  LOWER EXTREMITY  MMT:  Not tested at Eval PALPATION:   General  tight throughout posterior kinetic chain                External Perineal Exam normal                             Internal Pelvic Floor high tone levator Rt>Lt  Patient confirms identification and approves PT to assess internal pelvic floor and treatment Yes  PELVIC MMT:   MMT eval  Vaginal 3-4/5 for 1 rep, can hold 15 sec 3/5  Internal Anal Sphincter   External Anal Sphincter   Puborectalis   Diastasis Recti   (Blank rows = not tested)        TONE: high  PROLAPSE: Anterior wall not past introitus  TODAY'S TREATMENT:                                                                                                                              DATE: 03/27/23   Exercise: Bike x 6 min at L5 for warm up and status update Diagonal red band in staggered stance - 15x Half kneel pallof and shoulder ext - 10x Seated isometric core with shoulder adduction, press down and press up - kegel with hold - 10x each   Qped UE reach - 20x Qped LE reach- 20x Child  pose - 60 sec Standing at wall with post pelvic tilt to neutral - UE lift to 90 dge alternating arms - 6lb weight - 20x Lat pull down - 10lb bil with pulleys Punch 7lb - 15x each side Shoulder ext - 7lb bil - 20x      PATIENT EDUCATION:  Education details: [id number] and urge techniques Person educated: Patient Education method: Explanation, Demonstration, Verbal cues, and Handouts Education comprehension: verbalized understanding  HOME EXERCISE PROGRAM: [id number]  ASSESSMENT:  CLINICAL IMPRESSION: Pt has met all goals and is ind with final HEP today.  Pt is doing excellent and has progressed to challenging level of exercises.  She is expected to maintain progress on her own  at this time. D/c from skilled PT recommended  OBJECTIVE IMPAIRMENTS: decreased coordination, decreased endurance, decreased ROM, decreased strength, increased muscle spasms, impaired flexibility,  impaired tone, postural dysfunction, and pain.   ACTIVITY LIMITATIONS: continence and toileting  PARTICIPATION LIMITATIONS: interpersonal relationship  PERSONAL FACTORS: Time since onset of injury/illness/exacerbation and 1-2 comorbidities: c-section and VBAC in history  are also affecting patient's functional outcome.   REHAB POTENTIAL: Excellent  CLINICAL DECISION MAKING: Evolving/moderate complexity  EVALUATION COMPLEXITY: Moderate   GOALS: Goals reviewed with patient? Yes  SHORT TERM GOALS: Target date: 03/04/23  Ind with urgency techniques Baseline: Goal status: MET  2.  Ind with initial HEP Baseline:  Goal status: MET    LONG TERM GOALS: Target date: 04/29/23 Update 03/27/23  Pt will be independent with advanced HEP to maintain improvements made throughout therapy  Baseline:  Goal status: MET  2.  Pt will have 0-1/3 score of Marinoff scale  Baseline: 0/3 lMarinoff this week Goal status: MET  3.  Pt will have 75% less urgency due to bladder retraining and strengthening  Baseline: not an issue Goal status: MET  4.  Pt will be able to exercise and garden without increased symptoms Baseline: no leakage this week Goal status: MET  5.  Pt will be able to perform 5 quick flicks for bladder control due to being able to more quickly relax the pelvic floor Baseline: not having any difficulty Goal status: MET    PLAN:  PT FREQUENCY: 1x/week  PT DURATION: 12 weeks  PLANNED INTERVENTIONS: Therapeutic exercises, Therapeutic activity, Neuromuscular re-education, Balance training, Gait training, Patient/Family education, Self Care, Joint mobilization, Dry Needling, Electrical stimulation, Cryotherapy, Moist heat, Taping, Traction, Biofeedback, Manual therapy, and Re-evaluation  PLAN FOR NEXT SESSION: d/c  Junious Silk, PT 03/27/2023, 11:09 AM  PHYSICAL THERAPY DISCHARGE SUMMARY  Visits from Start of Care: 5  Current functional level related to goals /  functional outcomes: See above   Remaining deficits: See above   Education / Equipment: HEP   Patient agrees to discharge. Patient goals were met. Patient is being discharged due to being pleased with the current functional level.  Russella Dar, PT, DPT 03/27/23 5:42 PM

## 2023-03-27 ENCOUNTER — Ambulatory Visit: Payer: 59 | Attending: Obstetrics and Gynecology | Admitting: Physical Therapy

## 2023-03-27 DIAGNOSIS — M6281 Muscle weakness (generalized): Secondary | ICD-10-CM | POA: Insufficient documentation

## 2023-03-27 DIAGNOSIS — R279 Unspecified lack of coordination: Secondary | ICD-10-CM | POA: Diagnosis not present

## 2023-03-27 DIAGNOSIS — M62838 Other muscle spasm: Secondary | ICD-10-CM | POA: Diagnosis not present

## 2023-04-04 ENCOUNTER — Ambulatory Visit: Payer: 59 | Admitting: Physical Therapy

## 2023-04-11 ENCOUNTER — Encounter: Payer: 59 | Admitting: Physical Therapy

## 2023-05-11 ENCOUNTER — Other Ambulatory Visit: Payer: Self-pay | Admitting: Nurse Practitioner

## 2023-05-11 DIAGNOSIS — F325 Major depressive disorder, single episode, in full remission: Secondary | ICD-10-CM

## 2023-05-14 NOTE — Telephone Encounter (Signed)
Requested Prescriptions  Pending Prescriptions Disp Refills   sertraline (ZOLOFT) 50 MG tablet [Pharmacy Med Name: SERTRALINE TAB 50MG ] 90 tablet 0    Sig: TAKE 1 TABLET EVERY MORNING     Psychiatry:  Antidepressants - SSRI - sertraline Passed - 05/11/2023  8:17 PM      Passed - AST in normal range and within 360 days    AST  Date Value Ref Range Status  11/22/2022 24 0 - 40 IU/L Final         Passed - ALT in normal range and within 360 days    ALT  Date Value Ref Range Status  11/22/2022 24 0 - 32 IU/L Final         Passed - Completed PHQ-2 or PHQ-9 in the last 360 days      Passed - Valid encounter within last 6 months    Recent Outpatient Visits           5 months ago Annual physical exam   [facility] Larae Grooms, NP   11 months ago Primary hypertension   [place] [facility] Larae Grooms, NP   1 year ago Annual physical exam   [facility] Larae Grooms, NP   2 years ago Major depressive disorder with single episode, in full remission [facility])   [place] [facility] Larae Grooms, NP   2 years ago Routine general medical examination at a health care facility   [facility] Larae Grooms, NP       Future Appointments             In 1 week Mecum, Oswaldo Conroy, PA-C [facility]

## 2023-05-23 ENCOUNTER — Encounter: Payer: Self-pay | Admitting: Physician Assistant

## 2023-05-23 ENCOUNTER — Ambulatory Visit: Payer: 59 | Admitting: Physician Assistant

## 2023-05-23 VITALS — BP 110/71 | HR 71 | Temp 97.6°F | Wt 133.6 lb

## 2023-05-23 DIAGNOSIS — F325 Major depressive disorder, single episode, in full remission: Secondary | ICD-10-CM

## 2023-05-23 DIAGNOSIS — I10 Essential (primary) hypertension: Secondary | ICD-10-CM | POA: Diagnosis not present

## 2023-05-23 DIAGNOSIS — E7800 Pure hypercholesterolemia, unspecified: Secondary | ICD-10-CM | POA: Diagnosis not present

## 2023-05-23 DIAGNOSIS — G43909 Migraine, unspecified, not intractable, without status migrainosus: Secondary | ICD-10-CM

## 2023-05-23 DIAGNOSIS — F419 Anxiety disorder, unspecified: Secondary | ICD-10-CM

## 2023-05-23 MED ORDER — SERTRALINE HCL 50 MG PO TABS: 50.0000 mg | ORAL_TABLET | Freq: Every morning | ORAL | 0 refills | Status: DC

## 2023-05-23 MED ORDER — HYDROCHLOROTHIAZIDE 12.5 MG PO TABS: 12.5000 mg | ORAL_TABLET | Freq: Every day | ORAL | 1 refills | Status: DC

## 2023-05-23 NOTE — Assessment & Plan Note (Signed)
Chronic, historic condition The 10-year ASCVD risk score (Arnett DK, et al., 2019) is: 3.7%   Values used to calculate the score:     Age: 62 years     Sex: Female     Is Non-Hispanic African American: No     Diabetic: No     Tobacco smoker: No     Systolic Blood Pressure: 110 mmHg     Is BP treated: Yes     HDL Cholesterol: 63 mg/dL     Total Cholesterol: 233 mg/dL Reviewed most recent lipid panel - recheck today for monitoring  She is not taking cholesterol medication at present- lab results to dictate further management Continue with lifestyle management unless intervention dictated but lipid panel Follow up in 6 months or sooner if concerns arise

## 2023-05-23 NOTE — Progress Notes (Signed)
Established Patient Office Visit  Name: Sierra Curtis   MRN: [id number]    DOB: 09-13-1961   Date:05/23/2023  Today's Provider: Jacquelin Hawking, MHS, PA-C Introduced myself to the patient as a PA-C and provided education on APPs in clinical practice.         Subjective  Chief Complaint  Chief Complaint  Patient presents with   Hypertension   Hyperlipidemia    HPI  HYPERTENSION / HYPERLIPIDEMIA Satisfied with current treatment? yes Duration of hypertension: chronic BP monitoring frequency: rarely BP range:  BP medication side effects: no Past BP meds: hydrochlorothiazide 12.5 mg PO every day  Duration of hyperlipidemia: chronic Cholesterol medication side effects: no Cholesterol supplements: none Past cholesterol medications: None  Medication compliance: NA Aspirin: no Recent stressors: no Recurrent headaches: no Visual changes: no Palpitations: no Dyspnea: no Chest pain: no Lower extremity edema: no Dizzy/lightheaded: no  Anxiety/ Depression  She reports her mood is "great" She is taking Sertraline 50 mg PO every day  She reports she is at a point where is wonders if she needs it but does not want to stop taking it yet    Migraine She is taking Maxalt PRN for management  Infrequent - she states she has not had one in awhile Gets headaches occasionally but these respond well to Excedrin migraine She has not needed to take Maxalt for several months         05/23/2023    9:22 AM 11/22/2022   10:56 AM 05/24/2022    9:51 AM 10/30/2021    9:16 AM  GAD 7 : Generalized Anxiety Score  Nervous, Anxious, on Edge 0 0 0 0  Control/stop worrying 0 0 2 0  Worry too much - different things 0 0 1 0  Trouble relaxing 0 0 0 0  Restless 0 0 0 0  Easily annoyed or irritable 0 0 0 0  Afraid - awful might happen 0 0 0 0  Total GAD 7 Score 0 0 3 0  Anxiety Difficulty Not difficult at all Not difficult at all Not difficult at all Not difficult at all        05/23/2023    9:21 AM 11/22/2022   10:56 AM 05/24/2022    9:50 AM 10/30/2021    9:15 AM 04/27/2021   10:05 AM  Depression screen PHQ 2/9  Decreased Interest 0 0 0 0 0  Down, Depressed, Hopeless 0 0 0 0 0  PHQ - 2 Score 0 0 0 0 0  Altered sleeping 0 0 2 0 0  Tired, decreased energy 0 0 0 0 0  Change in appetite 0 0 0 0 0  Feeling bad or failure about yourself  0 0 0 0 0  Trouble concentrating 0 0 0 0 0  Moving slowly or fidgety/restless 0 0 0 0 0  Suicidal thoughts 0 0 0 0 0  PHQ-9 Score 0 0 2 0 0  Difficult doing work/chores Not difficult at all Not difficult at all Not difficult at all Not difficult at all Not difficult at all     Patient Active Problem List   Diagnosis Date Noted   Hypercholesterolemia 11/21/2022   Migraine 06/09/2019   Anxiety 11/05/2018   Menopause 11/05/2018   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   Depression    Hypertension     Past Surgical History:  Procedure Laterality Date   BREAST EXCISIONAL BIOPSY Right 2005   BREAST SURGERY  2003   CESAREAN SECTION     SPINE SURGERY      Family History  Problem Relation Age of Onset   Cancer Mother    Heart disease Father    Alcohol abuse Father    Diabetes Sister    Breast cancer Paternal Aunt    Stroke Maternal Grandmother    Heart disease Maternal Grandfather    Heart disease Paternal Grandmother    Congestive Heart Failure Paternal Grandmother    Heart disease Paternal Grandfather    Heart attack Paternal Grandfather     Social History   Tobacco Use   Smoking status: Never   Smokeless tobacco: Never  Substance Use Topics   Alcohol use: Yes    Alcohol/week: 2.0 standard drinks of alcohol    Types: 1 Glasses of wine, 1 Cans of beer per week    Comment: on occasion     Current Outpatient Medications:    B Complex-C (SUPER B COMPLEX PO), Take by mouth daily., Disp: , Rfl:    cetirizine (ZYRTEC) 10 MG tablet, Take 10 mg by mouth daily., Disp: , Rfl:    Cholecalciferol (VITAMIN D-3) 1000  UNITS CAPS, Take 1,000 Units by mouth daily., Disp: , Rfl:    [employer] BIOTIN PO, Take by mouth., Disp: , Rfl:    diazepam (VALIUM) 5 MG tablet, Take 1 tablet (5 mg total) by mouth every 12 (twelve) hours as needed for anxiety. Muscle cramps, Disp: 30 tablet, Rfl: 0   rizatriptan (MAXALT) 5 MG tablet, TAKE 1 TABLET BY MOUTH ONCE AS NEEDED FOR MIGRAINE FOR  UP TO ONE DOSE. MAY TAKE A  SECOND DOSE AFTER 2 HOURS  IF NEEDED., Disp: 10 tablet, Rfl: 2   hydrochlorothiazide (HYDRODIURIL) 12.5 MG tablet, Take 1 tablet (12.5 mg total) by mouth daily., Disp: 90 tablet, Rfl: 1   sertraline (ZOLOFT) 50 MG tablet, Take 1 tablet (50 mg total) by mouth every morning., Disp: 90 tablet, Rfl: 0  No Known Allergies  I personally reviewed active problem list, medication list, allergies, health maintenance, notes from last encounter, lab results with the patient/caregiver today.   Review of Systems  Eyes:  Negative for blurred vision and double vision.  Respiratory:  Negative for shortness of breath.   Cardiovascular:  Negative for chest pain, palpitations and leg swelling.  Musculoskeletal:  Negative for myalgias.  Neurological:  Negative for dizziness, tingling, loss of consciousness and headaches.  Psychiatric/Behavioral:  Negative for depression. The patient is not nervous/anxious.       Objective  Vitals:   05/23/23 0916  BP: 110/71  Pulse: 71  Temp: 97.6 F (36.4 C)  TempSrc: Oral  SpO2: 99%  Weight: 133 lb 9.6 oz (60.6 kg)    Body mass index is 20.43 kg/m.  Physical Exam Vitals reviewed.  Constitutional:      General: She is awake.     Appearance: Normal appearance. She is well-developed and well-groomed.  HENT:     Head: Normocephalic and atraumatic.  Eyes:     General: Lids are normal. Gaze aligned appropriately.  Cardiovascular:     Rate and Rhythm: Normal rate and regular rhythm.     Pulses: Normal pulses.          Radial pulses are 2+ on the right side and 2+ on the left side.      Heart sounds: Normal heart sounds. No murmur heard.    No friction rub. No gallop.  Pulmonary:     Effort: Pulmonary effort  is normal.     Breath sounds: Normal breath sounds. No decreased air movement. No decreased breath sounds, wheezing, rhonchi or rales.  Musculoskeletal:     Right lower leg: No edema.     Left lower leg: No edema.  Neurological:     General: No focal deficit present.     Mental Status: She is alert and oriented to person, place, and time.     GCS: GCS eye subscore is 4. GCS verbal subscore is 5. GCS motor subscore is 6.  Psychiatric:        Attention and Perception: Attention and perception normal.        Mood and Affect: Mood and affect normal.        Speech: Speech normal.        Behavior: Behavior normal. Behavior is cooperative.      No results found for this or any previous visit (from the past 2160 hour(s)).   PHQ2/9:    05/23/2023    9:21 AM 11/22/2022   10:56 AM 05/24/2022    9:50 AM 10/30/2021    9:15 AM 04/27/2021   10:05 AM  Depression screen PHQ 2/9  Decreased Interest 0 0 0 0 0  Down, Depressed, Hopeless 0 0 0 0 0  PHQ - 2 Score 0 0 0 0 0  Altered sleeping 0 0 2 0 0  Tired, decreased energy 0 0 0 0 0  Change in appetite 0 0 0 0 0  Feeling bad or failure about yourself  0 0 0 0 0  Trouble concentrating 0 0 0 0 0  Moving slowly or fidgety/restless 0 0 0 0 0  Suicidal thoughts 0 0 0 0 0  PHQ-9 Score 0 0 2 0 0  Difficult doing work/chores Not difficult at all Not difficult at all Not difficult at all Not difficult at all Not difficult at all      Fall Risk:    05/23/2023    9:20 AM 11/22/2022   10:56 AM 05/24/2022    9:50 AM 10/30/2021    9:15 AM 10/27/2020    3:22 PM  Fall Risk   Falls in the past year? 0 0 0 0 0  Number falls in past yr: 0 0 0 0 0  Injury with Fall? 0 0 0 0 0  Risk for fall due to : No Fall Risks No Fall Risks No Fall Risks No Fall Risks No Fall Risks  Follow up Falls evaluation completed Falls evaluation completed  Falls evaluation completed Falls evaluation completed Falls evaluation completed      Functional Status Survey:      Assessment & Plan  Problem List Items Addressed This Visit       Cardiovascular and Mediastinum   Hypertension    Chronic, historic condition Appears well managed with current regimen comprised of hydrochlorothiazide 12.5 mg PO every day  Continue current regimen Refills sent today Follow up in 6 months or sooner if concerns arise        Relevant Medications   hydrochlorothiazide (HYDRODIURIL) 12.5 MG tablet   Other Relevant Orders   Comp Met (CMET)   CBC w/Diff   Migraine    Chronic, historic condition Appears stable at this time She reports infrequent headaches that typically resolve with Excedrin migraine. She states she has not had to use Maxalt in several months. She denies need for refills at this time  Continue current regimen at this time  Follow up in 6 months or  sooner if concerns arise         Relevant Medications   hydrochlorothiazide (HYDRODIURIL) 12.5 MG tablet   sertraline (ZOLOFT) 50 MG tablet     Other   Depression    Chronic, historic condition  Appears well managed with Sertraline 50 mg PO every day  Reviewed today's PHQ9 and GAD7 results- both low to zero at this time She reports she is doing well with mood management and states she feels great at this time Continue current regimen for now. May be able to discuss discontinuation at follow up if she is still not having concerns Follow up in 6 months or sooner if concerns arise        Relevant Medications   sertraline (ZOLOFT) 50 MG tablet   Anxiety    Chronic, historic condition Appears well managed on Zoloft 50 mg PO every day  Continue current regimen Reviewd GAD7 results today with her - appears to be doing very well on regimen Follow up in 6 months or sooner if concerns arise        Relevant Medications   sertraline (ZOLOFT) 50 MG tablet   Hypercholesterolemia -  Primary    Chronic, historic condition The 10-year ASCVD risk score (Arnett DK, et al., 2019) is: 3.7%   Values used to calculate the score:     Age: 71 years     Sex: Female     Is Non-Hispanic African American: No     Diabetic: No     Tobacco smoker: No     Systolic Blood Pressure: 110 mmHg     Is BP treated: Yes     HDL Cholesterol: 63 mg/dL     Total Cholesterol: 233 mg/dL Reviewed most recent lipid panel - recheck today for monitoring  She is not taking cholesterol medication at present- lab results to dictate further management Continue with lifestyle management unless intervention dictated but lipid panel Follow up in 6 months or sooner if concerns arise        Relevant Medications   hydrochlorothiazide (HYDRODIURIL) 12.5 MG tablet   Other Relevant Orders   Lipid Profile     Return in about 6 months (around 11/22/2023) for HTN, HLD, Depression, Annual physical.   I, Franziska Podgurski E Licet Dunphy, PA-C, have reviewed all documentation for this visit. The documentation on 05/23/23 for the exam, diagnosis, procedures, and orders are all accurate and complete.   Jacquelin Hawking, MHS, PA-C [facility]

## 2023-05-23 NOTE — Assessment & Plan Note (Signed)
Chronic, historic condition  Appears well managed with Sertraline 50 mg PO every day  Reviewed today's PHQ9 and GAD7 results- both low to zero at this time She reports she is doing well with mood management and states she feels great at this time Continue current regimen for now. May be able to discuss discontinuation at follow up if she is still not having concerns Follow up in 6 months or sooner if concerns arise

## 2023-05-23 NOTE — Assessment & Plan Note (Signed)
Chronic, historic condition Appears well managed on Zoloft 50 mg PO every day  Continue current regimen Reviewd GAD7 results today with her - appears to be doing very well on regimen Follow up in 6 months or sooner if concerns arise

## 2023-05-23 NOTE — Assessment & Plan Note (Signed)
Chronic, historic condition Appears well managed with current regimen comprised of hydrochlorothiazide 12.5 mg PO every day  Continue current regimen Refills sent today Follow up in 6 months or sooner if concerns arise

## 2023-05-23 NOTE — Assessment & Plan Note (Signed)
Chronic, historic condition Appears stable at this time She reports infrequent headaches that typically resolve with Excedrin migraine. She states she has not had to use Maxalt in several months. She denies need for refills at this time  Continue current regimen at this time  Follow up in 6 months or sooner if concerns arise

## 2023-05-24 ENCOUNTER — Other Ambulatory Visit: Payer: 59

## 2023-05-24 DIAGNOSIS — I10 Essential (primary) hypertension: Secondary | ICD-10-CM

## 2023-05-24 DIAGNOSIS — E7800 Pure hypercholesterolemia, unspecified: Secondary | ICD-10-CM

## 2023-05-25 LAB — LIPID PANEL
Chol/HDL Ratio: 4.1 ratio (ref 0.0–4.4)
Cholesterol, Total: 252 mg/dL — ABNORMAL HIGH (ref 100–199)
HDL: 62 mg/dL (ref >39)
LDL Chol Calc (NIH): 169 mg/dL — ABNORMAL HIGH (ref 0–99)
Triglycerides: 118 mg/dL (ref 0–149)
VLDL Cholesterol Cal: 21 mg/dL (ref 5–40)

## 2023-05-25 LAB — CBC WITH DIFFERENTIAL/PLATELET
Basophils Absolute: 0 10*3/uL (ref 0.0–0.2)
Basos: 1 %
EOS (ABSOLUTE): 0.2 10*3/uL (ref 0.0–0.4)
Eos: 4 %
Hematocrit: 38.8 % (ref 34.0–46.6)
Hemoglobin: 12.6 g/dL (ref 11.1–15.9)
Immature Grans (Abs): 0 10*3/uL (ref 0.0–0.1)
Immature Granulocytes: 0 %
Lymphocytes Absolute: 1.6 10*3/uL (ref 0.7–3.1)
Lymphs: 34 %
MCH: 30.9 pg (ref 26.6–33.0)
MCHC: 32.5 g/dL (ref 31.5–35.7)
MCV: 95 fL (ref 79–97)
Monocytes Absolute: 0.5 10*3/uL (ref 0.1–0.9)
Monocytes: 10 %
Neutrophils Absolute: 2.3 10*3/uL (ref 1.4–7.0)
Neutrophils: 51 %
Platelets: 210 10*3/uL (ref 150–450)
RBC: 4.08 x10E6/uL (ref 3.77–5.28)
RDW: 12.1 % (ref 11.7–15.4)
WBC: 4.6 10*3/uL (ref 3.4–10.8)

## 2023-05-25 LAB — COMPREHENSIVE METABOLIC PANEL
ALT: 21 IU/L (ref 0–32)
AST: 23 IU/L (ref 0–40)
Albumin: 4.7 g/dL (ref 3.9–4.9)
Alkaline Phosphatase: 72 IU/L (ref 44–121)
BUN/Creatinine Ratio: 20 (ref 12–28)
BUN: 16 mg/dL (ref 8–27)
Bilirubin Total: 0.3 mg/dL (ref 0.0–1.2)
CO2: 28 mmol/L (ref 20–29)
Calcium: 10.2 mg/dL (ref 8.7–10.3)
Chloride: 101 mmol/L (ref 96–106)
Creatinine, Ser: 0.81 mg/dL (ref 0.57–1.00)
Globulin, Total: 2.1 g/dL (ref 1.5–4.5)
Glucose: 92 mg/dL (ref 70–99)
Potassium: 4.5 mmol/L (ref 3.5–5.2)
Sodium: 142 mmol/L (ref 134–144)
Total Protein: 6.8 g/dL (ref 6.0–8.5)
eGFR: 83 mL/min/{1.73_m2} (ref >59)

## 2023-06-03 NOTE — Progress Notes (Signed)
Your lab work was overall normal and stable at this time with the exception of your cholesterol.  Your cholesterol has increased since it was last checked 6 months ago.  I recommend decreasing your intake of saturated fats and increasing your exercise to assist with managing this.  We can recheck your levels in about 3 months and if they are not improving I recommend that we add a cholesterol medication to your regimen at that time.  Please let us know if you have any questions or concerns

## 2023-07-12 DIAGNOSIS — Z23 Encounter for immunization: Secondary | ICD-10-CM | POA: Diagnosis not present

## 2023-08-12 DIAGNOSIS — R519 Headache, unspecified: Secondary | ICD-10-CM | POA: Diagnosis not present

## 2023-08-12 DIAGNOSIS — M25512 Pain in left shoulder: Secondary | ICD-10-CM | POA: Diagnosis not present

## 2023-08-12 DIAGNOSIS — M25511 Pain in right shoulder: Secondary | ICD-10-CM | POA: Diagnosis not present

## 2023-08-12 DIAGNOSIS — M7918 Myalgia, other site: Secondary | ICD-10-CM | POA: Diagnosis not present

## 2023-08-12 DIAGNOSIS — S161XXA Strain of muscle, fascia and tendon at neck level, initial encounter: Secondary | ICD-10-CM | POA: Diagnosis not present

## 2023-08-12 DIAGNOSIS — M9900 Segmental and somatic dysfunction of head region: Secondary | ICD-10-CM | POA: Diagnosis not present

## 2023-08-12 DIAGNOSIS — M9901 Segmental and somatic dysfunction of cervical region: Secondary | ICD-10-CM | POA: Diagnosis not present

## 2023-08-12 DIAGNOSIS — M542 Cervicalgia: Secondary | ICD-10-CM | POA: Diagnosis not present

## 2023-08-13 DIAGNOSIS — M25512 Pain in left shoulder: Secondary | ICD-10-CM | POA: Diagnosis not present

## 2023-08-13 DIAGNOSIS — M542 Cervicalgia: Secondary | ICD-10-CM | POA: Diagnosis not present

## 2023-08-13 DIAGNOSIS — M7918 Myalgia, other site: Secondary | ICD-10-CM | POA: Diagnosis not present

## 2023-08-13 DIAGNOSIS — R519 Headache, unspecified: Secondary | ICD-10-CM | POA: Diagnosis not present

## 2023-08-13 DIAGNOSIS — M9901 Segmental and somatic dysfunction of cervical region: Secondary | ICD-10-CM | POA: Diagnosis not present

## 2023-08-13 DIAGNOSIS — M25511 Pain in right shoulder: Secondary | ICD-10-CM | POA: Diagnosis not present

## 2023-08-13 DIAGNOSIS — M9900 Segmental and somatic dysfunction of head region: Secondary | ICD-10-CM | POA: Diagnosis not present

## 2023-08-14 DIAGNOSIS — M9900 Segmental and somatic dysfunction of head region: Secondary | ICD-10-CM | POA: Diagnosis not present

## 2023-08-14 DIAGNOSIS — M9901 Segmental and somatic dysfunction of cervical region: Secondary | ICD-10-CM | POA: Diagnosis not present

## 2023-08-14 DIAGNOSIS — M542 Cervicalgia: Secondary | ICD-10-CM | POA: Diagnosis not present

## 2023-08-14 DIAGNOSIS — M25511 Pain in right shoulder: Secondary | ICD-10-CM | POA: Diagnosis not present

## 2023-08-14 DIAGNOSIS — M25512 Pain in left shoulder: Secondary | ICD-10-CM | POA: Diagnosis not present

## 2023-08-14 DIAGNOSIS — R519 Headache, unspecified: Secondary | ICD-10-CM | POA: Diagnosis not present

## 2023-08-14 DIAGNOSIS — M7918 Myalgia, other site: Secondary | ICD-10-CM | POA: Diagnosis not present

## 2023-08-15 DIAGNOSIS — M25512 Pain in left shoulder: Secondary | ICD-10-CM | POA: Diagnosis not present

## 2023-08-15 DIAGNOSIS — M25511 Pain in right shoulder: Secondary | ICD-10-CM | POA: Diagnosis not present

## 2023-08-15 DIAGNOSIS — M9900 Segmental and somatic dysfunction of head region: Secondary | ICD-10-CM | POA: Diagnosis not present

## 2023-08-15 DIAGNOSIS — M9901 Segmental and somatic dysfunction of cervical region: Secondary | ICD-10-CM | POA: Diagnosis not present

## 2023-08-15 DIAGNOSIS — R519 Headache, unspecified: Secondary | ICD-10-CM | POA: Diagnosis not present

## 2023-08-15 DIAGNOSIS — M542 Cervicalgia: Secondary | ICD-10-CM | POA: Diagnosis not present

## 2023-08-15 DIAGNOSIS — M7918 Myalgia, other site: Secondary | ICD-10-CM | POA: Diagnosis not present

## 2023-08-19 DIAGNOSIS — M9901 Segmental and somatic dysfunction of cervical region: Secondary | ICD-10-CM | POA: Diagnosis not present

## 2023-08-19 DIAGNOSIS — R519 Headache, unspecified: Secondary | ICD-10-CM | POA: Diagnosis not present

## 2023-08-19 DIAGNOSIS — M9900 Segmental and somatic dysfunction of head region: Secondary | ICD-10-CM | POA: Diagnosis not present

## 2023-08-19 DIAGNOSIS — M7918 Myalgia, other site: Secondary | ICD-10-CM | POA: Diagnosis not present

## 2023-08-19 DIAGNOSIS — M25512 Pain in left shoulder: Secondary | ICD-10-CM | POA: Diagnosis not present

## 2023-08-19 DIAGNOSIS — M25511 Pain in right shoulder: Secondary | ICD-10-CM | POA: Diagnosis not present

## 2023-08-19 DIAGNOSIS — M542 Cervicalgia: Secondary | ICD-10-CM | POA: Diagnosis not present

## 2023-08-20 DIAGNOSIS — R519 Headache, unspecified: Secondary | ICD-10-CM | POA: Diagnosis not present

## 2023-08-20 DIAGNOSIS — M9901 Segmental and somatic dysfunction of cervical region: Secondary | ICD-10-CM | POA: Diagnosis not present

## 2023-08-20 DIAGNOSIS — M542 Cervicalgia: Secondary | ICD-10-CM | POA: Diagnosis not present

## 2023-08-20 DIAGNOSIS — M7918 Myalgia, other site: Secondary | ICD-10-CM | POA: Diagnosis not present

## 2023-08-20 DIAGNOSIS — M9900 Segmental and somatic dysfunction of head region: Secondary | ICD-10-CM | POA: Diagnosis not present

## 2023-08-20 DIAGNOSIS — M25511 Pain in right shoulder: Secondary | ICD-10-CM | POA: Diagnosis not present

## 2023-08-20 DIAGNOSIS — M25512 Pain in left shoulder: Secondary | ICD-10-CM | POA: Diagnosis not present

## 2023-08-26 DIAGNOSIS — M542 Cervicalgia: Secondary | ICD-10-CM | POA: Diagnosis not present

## 2023-08-26 DIAGNOSIS — M25512 Pain in left shoulder: Secondary | ICD-10-CM | POA: Diagnosis not present

## 2023-08-26 DIAGNOSIS — M9900 Segmental and somatic dysfunction of head region: Secondary | ICD-10-CM | POA: Diagnosis not present

## 2023-08-26 DIAGNOSIS — R519 Headache, unspecified: Secondary | ICD-10-CM | POA: Diagnosis not present

## 2023-08-26 DIAGNOSIS — M9901 Segmental and somatic dysfunction of cervical region: Secondary | ICD-10-CM | POA: Diagnosis not present

## 2023-08-26 DIAGNOSIS — M25511 Pain in right shoulder: Secondary | ICD-10-CM | POA: Diagnosis not present

## 2023-08-26 DIAGNOSIS — M7918 Myalgia, other site: Secondary | ICD-10-CM | POA: Diagnosis not present

## 2023-08-28 DIAGNOSIS — M9901 Segmental and somatic dysfunction of cervical region: Secondary | ICD-10-CM | POA: Diagnosis not present

## 2023-08-28 DIAGNOSIS — M542 Cervicalgia: Secondary | ICD-10-CM | POA: Diagnosis not present

## 2023-08-28 DIAGNOSIS — M9900 Segmental and somatic dysfunction of head region: Secondary | ICD-10-CM | POA: Diagnosis not present

## 2023-08-28 DIAGNOSIS — M7918 Myalgia, other site: Secondary | ICD-10-CM | POA: Diagnosis not present

## 2023-08-28 DIAGNOSIS — M25511 Pain in right shoulder: Secondary | ICD-10-CM | POA: Diagnosis not present

## 2023-08-28 DIAGNOSIS — R519 Headache, unspecified: Secondary | ICD-10-CM | POA: Diagnosis not present

## 2023-08-28 DIAGNOSIS — M25512 Pain in left shoulder: Secondary | ICD-10-CM | POA: Diagnosis not present

## 2023-08-29 DIAGNOSIS — M9901 Segmental and somatic dysfunction of cervical region: Secondary | ICD-10-CM | POA: Diagnosis not present

## 2023-08-29 DIAGNOSIS — M7918 Myalgia, other site: Secondary | ICD-10-CM | POA: Diagnosis not present

## 2023-08-29 DIAGNOSIS — M542 Cervicalgia: Secondary | ICD-10-CM | POA: Diagnosis not present

## 2023-08-29 DIAGNOSIS — R519 Headache, unspecified: Secondary | ICD-10-CM | POA: Diagnosis not present

## 2023-08-29 DIAGNOSIS — M9900 Segmental and somatic dysfunction of head region: Secondary | ICD-10-CM | POA: Diagnosis not present

## 2023-08-29 DIAGNOSIS — M25511 Pain in right shoulder: Secondary | ICD-10-CM | POA: Diagnosis not present

## 2023-08-29 DIAGNOSIS — M25512 Pain in left shoulder: Secondary | ICD-10-CM | POA: Diagnosis not present

## 2023-09-05 DIAGNOSIS — M9901 Segmental and somatic dysfunction of cervical region: Secondary | ICD-10-CM | POA: Diagnosis not present

## 2023-09-05 DIAGNOSIS — M7918 Myalgia, other site: Secondary | ICD-10-CM | POA: Diagnosis not present

## 2023-09-05 DIAGNOSIS — R519 Headache, unspecified: Secondary | ICD-10-CM | POA: Diagnosis not present

## 2023-09-05 DIAGNOSIS — M25511 Pain in right shoulder: Secondary | ICD-10-CM | POA: Diagnosis not present

## 2023-09-05 DIAGNOSIS — M9900 Segmental and somatic dysfunction of head region: Secondary | ICD-10-CM | POA: Diagnosis not present

## 2023-09-05 DIAGNOSIS — M25512 Pain in left shoulder: Secondary | ICD-10-CM | POA: Diagnosis not present

## 2023-09-05 DIAGNOSIS — M542 Cervicalgia: Secondary | ICD-10-CM | POA: Diagnosis not present

## 2023-09-09 DIAGNOSIS — M9901 Segmental and somatic dysfunction of cervical region: Secondary | ICD-10-CM | POA: Diagnosis not present

## 2023-09-09 DIAGNOSIS — M25511 Pain in right shoulder: Secondary | ICD-10-CM | POA: Diagnosis not present

## 2023-09-09 DIAGNOSIS — M7918 Myalgia, other site: Secondary | ICD-10-CM | POA: Diagnosis not present

## 2023-09-09 DIAGNOSIS — M25512 Pain in left shoulder: Secondary | ICD-10-CM | POA: Diagnosis not present

## 2023-09-09 DIAGNOSIS — R519 Headache, unspecified: Secondary | ICD-10-CM | POA: Diagnosis not present

## 2023-09-09 DIAGNOSIS — M9900 Segmental and somatic dysfunction of head region: Secondary | ICD-10-CM | POA: Diagnosis not present

## 2023-09-09 DIAGNOSIS — M542 Cervicalgia: Secondary | ICD-10-CM | POA: Diagnosis not present

## 2023-10-15 ENCOUNTER — Other Ambulatory Visit: Payer: Self-pay

## 2023-10-15 DIAGNOSIS — F325 Major depressive disorder, single episode, in full remission: Secondary | ICD-10-CM

## 2023-10-15 MED ORDER — SERTRALINE HCL 50 MG PO TABS
50.0000 mg | ORAL_TABLET | Freq: Every morning | ORAL | 0 refills | Status: DC
Start: 2023-10-15 — End: 2023-11-22

## 2023-10-25 DIAGNOSIS — M5412 Radiculopathy, cervical region: Secondary | ICD-10-CM | POA: Diagnosis not present

## 2023-10-28 DIAGNOSIS — Z01818 Encounter for other preprocedural examination: Secondary | ICD-10-CM | POA: Diagnosis not present

## 2023-10-28 DIAGNOSIS — Z860101 Personal history of adenomatous and serrated colon polyps: Secondary | ICD-10-CM | POA: Diagnosis not present

## 2023-10-28 DIAGNOSIS — Z1211 Encounter for screening for malignant neoplasm of colon: Secondary | ICD-10-CM | POA: Diagnosis not present

## 2023-11-22 ENCOUNTER — Other Ambulatory Visit: Payer: Self-pay | Admitting: Nurse Practitioner

## 2023-11-22 ENCOUNTER — Encounter: Payer: Self-pay | Admitting: Nurse Practitioner

## 2023-11-22 ENCOUNTER — Ambulatory Visit (INDEPENDENT_AMBULATORY_CARE_PROVIDER_SITE_OTHER): Payer: 59 | Admitting: Nurse Practitioner

## 2023-11-22 VITALS — BP 104/68 | HR 67 | Temp 98.7°F | Resp 17 | Ht 67.95 in | Wt 133.0 lb

## 2023-11-22 DIAGNOSIS — F419 Anxiety disorder, unspecified: Secondary | ICD-10-CM

## 2023-11-22 DIAGNOSIS — F325 Major depressive disorder, single episode, in full remission: Secondary | ICD-10-CM

## 2023-11-22 DIAGNOSIS — I10 Essential (primary) hypertension: Secondary | ICD-10-CM | POA: Diagnosis not present

## 2023-11-22 DIAGNOSIS — E7800 Pure hypercholesterolemia, unspecified: Secondary | ICD-10-CM | POA: Diagnosis not present

## 2023-11-22 DIAGNOSIS — Z1231 Encounter for screening mammogram for malignant neoplasm of breast: Secondary | ICD-10-CM

## 2023-11-22 DIAGNOSIS — Z0000 Encounter for general adult medical examination without abnormal findings: Secondary | ICD-10-CM

## 2023-11-22 LAB — URINALYSIS, ROUTINE W REFLEX MICROSCOPIC
Bilirubin, UA: NEGATIVE
Glucose, UA: NEGATIVE
Ketones, UA: NEGATIVE
Nitrite, UA: NEGATIVE
Protein,UA: NEGATIVE
RBC, UA: NEGATIVE
Specific Gravity, UA: 1.015 (ref 1.005–1.030)
Urobilinogen, Ur: 0.2 mg/dL (ref 0.2–1.0)
pH, UA: 6.5 (ref 5.0–7.5)

## 2023-11-22 LAB — MICROSCOPIC EXAMINATION: RBC, Urine: NONE SEEN /[HPF] (ref 0–2)

## 2023-11-22 MED ORDER — HYDROCHLOROTHIAZIDE 12.5 MG PO TABS
12.5000 mg | ORAL_TABLET | Freq: Every day | ORAL | 1 refills | Status: DC
Start: 2023-11-22 — End: 2024-05-22

## 2023-11-22 MED ORDER — RIZATRIPTAN BENZOATE 5 MG PO TABS: ORAL_TABLET | ORAL | 2 refills | Status: AC

## 2023-11-22 MED ORDER — SERTRALINE HCL 50 MG PO TABS
50.0000 mg | ORAL_TABLET | Freq: Every morning | ORAL | 1 refills | Status: DC
Start: 2023-11-22 — End: 2024-05-22

## 2023-11-22 NOTE — Assessment & Plan Note (Signed)
 Chronic.  Controlled.  Continue with current medication regimen.  Labs ordered today.  Return to clinic in 6 months for reevaluation.  Call sooner if concerns arise.  ? ?

## 2023-11-22 NOTE — Assessment & Plan Note (Signed)
Chronic.  Controlled.  Continue with current medication regimen of HCTZ 12.5mg .  Refills sent today.  Labs ordered today.  Return to clinic in 6 months for reevaluation.  Call sooner if concerns arise.

## 2023-11-22 NOTE — Assessment & Plan Note (Signed)
 Chronic.  Controlled.  Continue with current medication regimen.  Labs ordered today.  Return to clinic in 6 months for reevaluation.  Call sooner if concerns arise.   The 10-year ASCVD risk score (Arnett DK, et al., 2019) is: 3.6%   Values used to calculate the score:     Age: 63 years     Sex: Female     Is Non-Hispanic African American: No     Diabetic: No     Tobacco smoker: No     Systolic Blood Pressure: 104 mmHg     Is BP treated: Yes     HDL Cholesterol: 62 mg/dL     Total Cholesterol: 252 mg/dL

## 2023-11-22 NOTE — Assessment & Plan Note (Signed)
Chronic.  Controlled.  Continue with current medication regimen of Zoloft 25mg daily.  Refills sent today.  Labs ordered today.  Return to clinic in 6 months for reevaluation.  Call sooner if concerns arise.   

## 2023-11-22 NOTE — Progress Notes (Signed)
 BP 104/68 (BP Location: Right Arm, Patient Position: Sitting)   Pulse 67   Temp 98.7 F (37.1 C) (Oral)   Resp 17   Ht 5' 7.95" (1.726 m)   Wt 133 lb (60.3 kg)   SpO2 100%   BMI 20.25 kg/m    Subjective:    Patient ID: Sierra Curtis, female    DOB: August 08, 1961, 63 y.o.   MRN: [id number]  HPI: Sierra Curtis is a 63 y.o. female presenting on 11/22/2023 for comprehensive medical examination. Current medical complaints include:none  She currently lives with: Menopausal Symptoms: no  HYPERTENSION / HYPERLIPIDEMIA Satisfied with current treatment? yes Duration of hypertension: years BP monitoring frequency: not checking BP range:  BP medication side effects: no Past BP meds: HCTZ Duration of hyperlipidemia: years Cholesterol medication side effects: no Cholesterol supplements: none Past cholesterol medications: none Medication compliance: excellent compliance Aspirin: no Recent stressors: no Recurrent headaches: no Visual changes: no Palpitations: no Dyspnea: no Chest pain: no Lower extremity edema: no Dizzy/lightheaded: no  ANXIETY/DEPRESSION Patient is taking Zoloft 50mg  daily.  Feels like this is working well for her.  Denies concerns at visit today.     Depression Screen done today and results listed below:     11/22/2023    9:09 AM 05/23/2023    9:21 AM 11/22/2022   10:56 AM 05/24/2022    9:50 AM 10/30/2021    9:15 AM  Depression screen PHQ 2/9  Decreased Interest 0 0 0 0 0  Down, Depressed, Hopeless 0 0 0 0 0  PHQ - 2 Score 0 0 0 0 0  Altered sleeping 0 0 0 2 0  Tired, decreased energy 0 0 0 0 0  Change in appetite 0 0 0 0 0  Feeling bad or failure about yourself  0 0 0 0 0  Trouble concentrating 0 0 0 0 0  Moving slowly or fidgety/restless 0 0 0 0 0  Suicidal thoughts 0 0 0 0 0  PHQ-9 Score 0 0 0 2 0  Difficult doing work/chores Not difficult at all Not difficult at all Not difficult at all Not difficult at all Not difficult at all    The patient  does not have a history of falls. I did complete a risk assessment for falls. A plan of care for falls was documented.   Past Medical History:  Past Medical History:  Diagnosis Date   Anxiety 2006   No longer an issue   Depression    Hypertension     Surgical History:  Past Surgical History:  Procedure Laterality Date   BREAST EXCISIONAL BIOPSY Right 2005   BREAST SURGERY  2003   CESAREAN SECTION     SPINE SURGERY      Medications:  Current Outpatient Medications on File Prior to Visit  Medication Sig   B Complex-C (SUPER B COMPLEX PO) Take by mouth daily.   cetirizine (ZYRTEC) 10 MG tablet Take 10 mg by mouth daily.   Cholecalciferol (VITAMIN D-3) 1000 UNITS CAPS Take 1,000 Units by mouth daily.   [employer] BIOTIN PO Take by mouth.   diazepam (VALIUM) 5 MG tablet Take 1 tablet (5 mg total) by mouth every 12 (twelve) hours as needed for anxiety. Muscle cramps   No current facility-administered medications on file prior to visit.    Allergies:  No Known Allergies  Social History:  Social History   Socioeconomic History   Marital status: Married    Spouse name: Not on file  Number of children: Not on file   Years of education: Not on file   Highest education level: Bachelor's degree (e.g., BA, AB, BS)  Occupational History   Not on file  Tobacco Use   Smoking status: Never   Smokeless tobacco: Never  Vaping Use   Vaping status: Never Used  Substance and Sexual Activity   Alcohol use: Yes    Alcohol/week: 2.0 standard drinks of alcohol    Types: 1 Glasses of wine, 1 Cans of beer per week    Comment: on occasion   Drug use: No   Sexual activity: Yes    Birth control/protection: Post-menopausal  Other Topics Concern   Not on file  Social History Narrative   Not on file   Social Drivers of Health   Financial Resource Strain: Low Risk  (11/19/2023)   Overall Financial Resource Strain (CARDIA)    Difficulty of Paying Living Expenses: Not hard at all  Food  Insecurity: No Food Insecurity (11/19/2023)   Hunger Vital Sign    Worried About Running Out of Food in the Last Year: Never true    Ran Out of Food in the Last Year: Never true  Transportation Needs: No Transportation Needs (11/19/2023)   PRAPARE - Administrator, Civil Service (Medical): No    Lack of Transportation (Non-Medical): No  Physical Activity: Insufficiently Active (11/19/2023)   Exercise Vital Sign    Days of Exercise per Week: 3 days    Minutes of Exercise per Session: 30 min  Stress: No Stress Concern Present (11/19/2023)   [employer] of Occupational Health - Occupational Stress Questionnaire    Feeling of Stress : Not at all  Social Connections: Unknown (11/19/2023)   Social Connection and Isolation Panel [NHANES]    Frequency of Communication with Friends and Family: More than three times a week    Frequency of Social Gatherings with Friends and Family: Twice a week    Attends Religious Services: 1 to 4 times per year    Active Member of [employer] or Organizations: Patient declined    Attends Engineer, structural: Not on file    Marital Status: Married  Catering manager Violence: Not on file   Social History   Tobacco Use  Smoking Status Never  Smokeless Tobacco Never   Social History   Substance and Sexual Activity  Alcohol Use Yes   Alcohol/week: 2.0 standard drinks of alcohol   Types: 1 Glasses of wine, 1 Cans of beer per week   Comment: on occasion    Family History:  Family History  Problem Relation Age of Onset   Cancer Mother    Heart disease Father    Alcohol abuse Father    Hypertension Father    Diabetes Sister    Breast cancer Paternal Aunt    Stroke Maternal Grandmother    Heart disease Maternal Grandfather    Heart disease Paternal Grandmother    Congestive Heart Failure Paternal Grandmother    Heart disease Paternal Grandfather    Heart attack Paternal Grandfather     Past medical history, surgical history,  medications, allergies, family history and social history reviewed with patient today and changes made to appropriate areas of the chart.   Review of Systems  Eyes:  Negative for blurred vision and double vision.  Respiratory:  Negative for shortness of breath.   Cardiovascular:  Negative for chest pain, palpitations and leg swelling.  Neurological:  Negative for dizziness and headaches.  Psychiatric/Behavioral:  Positive for depression. Negative for suicidal ideas. The patient is nervous/anxious.    All other ROS negative except what is listed above and in the HPI.      Objective:    BP 104/68 (BP Location: Right Arm, Patient Position: Sitting)   Pulse 67   Temp 98.7 F (37.1 C) (Oral)   Resp 17   Ht 5' 7.95" (1.726 m)   Wt 133 lb (60.3 kg)   SpO2 100%   BMI 20.25 kg/m   Wt Readings from Last 3 Encounters:  11/22/23 133 lb (60.3 kg)  05/23/23 133 lb 9.6 oz (60.6 kg)  11/22/22 135 lb (61.2 kg)    Physical Exam Vitals and nursing note reviewed.  Constitutional:      General: She is awake. She is not in acute distress.    Appearance: Normal appearance. She is well-developed. She is not ill-appearing.  HENT:     Head: Normocephalic and atraumatic.     Right Ear: Hearing, tympanic membrane, ear canal and external ear normal. No drainage.     Left Ear: Hearing, tympanic membrane, ear canal and external ear normal. No drainage.     Nose: Nose normal.     Right Sinus: No maxillary sinus tenderness or frontal sinus tenderness.     Left Sinus: No maxillary sinus tenderness or frontal sinus tenderness.     Mouth/Throat:     Mouth: Mucous membranes are moist.     Pharynx: Oropharynx is clear. Uvula midline. No pharyngeal swelling, oropharyngeal exudate or posterior oropharyngeal erythema.  Eyes:     General: Lids are normal.        Right eye: No discharge.        Left eye: No discharge.     Extraocular Movements: Extraocular movements intact.     Conjunctiva/sclera: Conjunctivae  normal.     Pupils: Pupils are equal, round, and reactive to light.     Visual Fields: Right eye visual fields normal and left eye visual fields normal.  Neck:     Thyroid: No thyromegaly.     Vascular: No carotid bruit.     Trachea: Trachea normal.  Cardiovascular:     Rate and Rhythm: Normal rate and regular rhythm.     Heart sounds: Normal heart sounds. No murmur heard.    No gallop.  Pulmonary:     Effort: Pulmonary effort is normal. No accessory muscle usage or respiratory distress.     Breath sounds: Normal breath sounds.  Chest:  Breasts:    Right: Normal.     Left: Normal.  Abdominal:     General: Bowel sounds are normal.     Palpations: Abdomen is soft. There is no hepatomegaly or splenomegaly.     Tenderness: There is no abdominal tenderness.  Musculoskeletal:        General: Normal range of motion.     Cervical back: Normal range of motion and neck supple.     Right lower leg: No edema.     Left lower leg: No edema.  Lymphadenopathy:     Head:     Right side of head: No submental, submandibular, tonsillar, preauricular or posterior auricular adenopathy.     Left side of head: No submental, submandibular, tonsillar, preauricular or posterior auricular adenopathy.     Cervical: No cervical adenopathy.     Upper Body:     Right upper body: No supraclavicular, axillary or pectoral adenopathy.     Left upper body: No supraclavicular, axillary or pectoral  adenopathy.  Skin:    General: Skin is warm and dry.     Capillary Refill: Capillary refill takes less than 2 seconds.     Findings: No rash.  Neurological:     Mental Status: She is alert and oriented to person, place, and time.     Gait: Gait is intact.  Psychiatric:        Attention and Perception: Attention normal.        Mood and Affect: Mood normal.        Speech: Speech normal.        Behavior: Behavior normal. Behavior is cooperative.        Thought Content: Thought content normal.        Judgment:  Judgment normal.     Results for orders placed or performed in visit on 05/24/23  Lipid Profile   Collection Time: 05/24/23  8:32 AM  Result Value Ref Range   Cholesterol, Total 252 (H) 100 - 199 mg/dL   Triglycerides 161 0 - 149 mg/dL   HDL 62 >09 mg/dL   VLDL Cholesterol Cal 21 5 - 40 mg/dL   LDL Chol Calc (NIH) 604 (H) 0 - 99 mg/dL   Chol/HDL Ratio 4.1 0.0 - 4.4 ratio  CBC w/Diff   Collection Time: 05/24/23  8:32 AM  Result Value Ref Range   WBC 4.6 3.4 - 10.8 x10E3/uL   RBC 4.08 3.77 - 5.28 x10E6/uL   Hemoglobin 12.6 11.1 - 15.9 g/dL   Hematocrit 54.0 98.1 - 46.6 %   MCV 95 79 - 97 fL   MCH 30.9 26.6 - 33.0 pg   MCHC 32.5 31.5 - 35.7 g/dL   RDW 19.1 47.8 - 29.5 %   Platelets 210 150 - 450 x10E3/uL   Neutrophils 51 Not Estab. %   Lymphs 34 Not Estab. %   Monocytes 10 Not Estab. %   Eos 4 Not Estab. %   Basos 1 Not Estab. %   Neutrophils Absolute 2.3 1.4 - 7.0 x10E3/uL   Lymphocytes Absolute 1.6 0.7 - 3.1 x10E3/uL   Monocytes Absolute 0.5 0.1 - 0.9 x10E3/uL   EOS (ABSOLUTE) 0.2 0.0 - 0.4 x10E3/uL   Basophils Absolute 0.0 0.0 - 0.2 x10E3/uL   Immature Granulocytes 0 Not Estab. %   Immature Grans (Abs) 0.0 0.0 - 0.1 x10E3/uL  Comp Met (CMET)   Collection Time: 05/24/23  8:32 AM  Result Value Ref Range   Glucose 92 70 - 99 mg/dL   BUN 16 8 - 27 mg/dL   Creatinine, Ser 6.21 0.57 - 1.00 mg/dL   eGFR 83 >30 QM/VHQ/4.69   BUN/Creatinine Ratio 20 12 - 28   Sodium 142 134 - 144 mmol/L   Potassium 4.5 3.5 - 5.2 mmol/L   Chloride 101 96 - 106 mmol/L   CO2 28 20 - 29 mmol/L   Calcium 10.2 8.7 - 10.3 mg/dL   Total Protein 6.8 6.0 - 8.5 g/dL   Albumin 4.7 3.9 - 4.9 g/dL   Globulin, Total 2.1 1.5 - 4.5 g/dL   Bilirubin Total 0.3 0.0 - 1.2 mg/dL   Alkaline Phosphatase 72 44 - 121 IU/L   AST 23 0 - 40 IU/L   ALT 21 0 - 32 IU/L      Assessment & Plan:   Problem List Items Addressed This Visit       Cardiovascular and Mediastinum   Hypertension   Chronic.   Controlled.  Continue with current medication regimen of HCTZ 12.5mg .  Refills sent today. Labs ordered today.  Return to clinic in 6 months for reevaluation.  Call sooner if concerns arise.        Relevant Medications   hydrochlorothiazide (HYDRODIURIL) 12.5 MG tablet     Other   Depression   Chronic.  Controlled.  Continue with current medication regimen of Zoloft 25mg  daily.  Refills sent today.  Labs ordered today.  Return to clinic in 6 months for reevaluation.  Call sooner if concerns arise.        Relevant Medications   sertraline (ZOLOFT) 50 MG tablet   Anxiety   Chronic.  Controlled.  Continue with current medication regimen.  Labs ordered today.  Return to clinic in 6 months for reevaluation.  Call sooner if concerns arise.        Relevant Medications   sertraline (ZOLOFT) 50 MG tablet   Hypercholesterolemia   Chronic.  Controlled.  Continue with current medication regimen.  Labs ordered today.  Return to clinic in 6 months for reevaluation.  Call sooner if concerns arise.   The 10-year ASCVD risk score (Arnett DK, et al., 2019) is: 3.6%   Values used to calculate the score:     Age: 68 years     Sex: Female     Is Non-Hispanic African American: No     Diabetic: No     Tobacco smoker: No     Systolic Blood Pressure: 104 mmHg     Is BP treated: Yes     HDL Cholesterol: 62 mg/dL     Total Cholesterol: 252 mg/dL       Relevant Medications   hydrochlorothiazide (HYDRODIURIL) 12.5 MG tablet   Other Relevant Orders   Lipid panel   Other Visit Diagnoses       Annual physical exam    -  Primary   Health maintenance reviewed during visit today.  Labs ordered.  Vaccines reviewed.  Mammogram, PAP and Colonoscopy up to date.   Relevant Orders   CBC with Differential/Platelet   Comprehensive metabolic panel   Lipid panel   TSH   Urinalysis, Routine w reflex microscopic     Encounter for screening mammogram for malignant neoplasm of breast       Relevant Orders    MM 3D SCREENING MAMMOGRAM BILATERAL BREAST        Follow up plan: Return in about 6 months (around 05/21/2024) for HTN, HLD, DM2 FU.   LABORATORY TESTING:  - Pap smear: up to date  IMMUNIZATIONS:   - Tdap: Tetanus vaccination status reviewed: last tetanus booster within 10 years. - Influenza: Up to date - Pneumovax: Up to date - Prevnar: Up to date - COVID: Up to date - HPV: Not applicable - Shingrix vaccine: Up to date  SCREENING: -Mammogram: Up to date  - Colonoscopy: Up to date  - Bone Density: Not applicable  -Hearing Test: Not applicable  -Spirometry: Not applicable   PATIENT COUNSELING:   Advised to take 1 mg of folate supplement per day if capable of pregnancy.   Sexuality: Discussed sexually transmitted diseases, partner selection, use of condoms, avoidance of unintended pregnancy  and contraceptive alternatives.   Advised to avoid cigarette smoking.  I discussed with the patient that most people either abstain from alcohol or drink within safe limits (<=14/week and <=4 drinks/occasion for males, <=7/weeks and <= 3 drinks/occasion for females) and that the risk for alcohol disorders and other health effects rises proportionally with the number of drinks per week and  how often a drinker exceeds daily limits.  Discussed cessation/primary prevention of drug use and availability of treatment for abuse.   Diet: Encouraged to adjust caloric intake to maintain  or achieve ideal body weight, to reduce intake of dietary saturated fat and total fat, to limit sodium intake by avoiding high sodium foods and not adding table salt, and to maintain adequate dietary potassium and calcium preferably from fresh fruits, vegetables, and low-fat dairy products.    stressed the importance of regular exercise  Injury prevention: Discussed safety belts, safety helmets, smoke detector, smoking near bedding or upholstery.   Dental health: Discussed importance of regular tooth brushing,  flossing, and dental visits.    NEXT PREVENTATIVE PHYSICAL DUE IN 1 YEAR. Return in about 6 months (around 05/21/2024) for HTN, HLD, DM2 FU.

## 2023-11-23 LAB — LIPID PANEL
Chol/HDL Ratio: 4.2 ratio (ref 0.0–4.4)
Cholesterol, Total: 226 mg/dL — ABNORMAL HIGH (ref 100–199)
HDL: 54 mg/dL (ref >39)
LDL Chol Calc (NIH): 151 mg/dL — ABNORMAL HIGH (ref 0–99)
Triglycerides: 117 mg/dL (ref 0–149)
VLDL Cholesterol Cal: 21 mg/dL (ref 5–40)

## 2023-11-23 LAB — CBC WITH DIFFERENTIAL/PLATELET
Basophils Absolute: 0 10*3/uL (ref 0.0–0.2)
Basos: 1 %
EOS (ABSOLUTE): 0.1 10*3/uL (ref 0.0–0.4)
Eos: 3 %
Hematocrit: 38.4 % (ref 34.0–46.6)
Hemoglobin: 12.6 g/dL (ref 11.1–15.9)
Immature Grans (Abs): 0 10*3/uL (ref 0.0–0.1)
Immature Granulocytes: 0 %
Lymphocytes Absolute: 1.5 10*3/uL (ref 0.7–3.1)
Lymphs: 38 %
MCH: 32.2 pg (ref 26.6–33.0)
MCHC: 32.8 g/dL (ref 31.5–35.7)
MCV: 98 fL — ABNORMAL HIGH (ref 79–97)
Monocytes Absolute: 0.4 10*3/uL (ref 0.1–0.9)
Monocytes: 9 %
Neutrophils Absolute: 2 10*3/uL (ref 1.4–7.0)
Neutrophils: 49 %
Platelets: 207 10*3/uL (ref 150–450)
RBC: 3.91 x10E6/uL (ref 3.77–5.28)
RDW: 12.6 % (ref 11.7–15.4)
WBC: 4 10*3/uL (ref 3.4–10.8)

## 2023-11-23 LAB — COMPREHENSIVE METABOLIC PANEL
ALT: 25 IU/L (ref 0–32)
AST: 21 IU/L (ref 0–40)
Albumin: 4.6 g/dL (ref 3.9–4.9)
Alkaline Phosphatase: 67 IU/L (ref 44–121)
BUN/Creatinine Ratio: 15 (ref 12–28)
BUN: 11 mg/dL (ref 8–27)
Bilirubin Total: 0.3 mg/dL (ref 0.0–1.2)
CO2: 25 mmol/L (ref 20–29)
Calcium: 10 mg/dL (ref 8.7–10.3)
Chloride: 102 mmol/L (ref 96–106)
Creatinine, Ser: 0.74 mg/dL (ref 0.57–1.00)
Globulin, Total: 2 g/dL (ref 1.5–4.5)
Glucose: 92 mg/dL (ref 70–99)
Potassium: 4.1 mmol/L (ref 3.5–5.2)
Sodium: 143 mmol/L (ref 134–144)
Total Protein: 6.6 g/dL (ref 6.0–8.5)
eGFR: 92 mL/min/{1.73_m2} (ref >59)

## 2023-11-23 LAB — TSH: TSH: 2.36 u[IU]/mL (ref 0.450–4.500)

## 2023-11-25 ENCOUNTER — Encounter: Payer: Self-pay | Admitting: Nurse Practitioner

## 2023-12-25 ENCOUNTER — Ambulatory Visit (INDEPENDENT_AMBULATORY_CARE_PROVIDER_SITE_OTHER): Payer: Self-pay

## 2023-12-25 DIAGNOSIS — Z860101 Personal history of adenomatous and serrated colon polyps: Secondary | ICD-10-CM | POA: Diagnosis not present

## 2023-12-25 DIAGNOSIS — Z09 Encounter for follow-up examination after completed treatment for conditions other than malignant neoplasm: Secondary | ICD-10-CM | POA: Diagnosis present

## 2023-12-25 DIAGNOSIS — Z860100 Personal history of colon polyps, unspecified: Secondary | ICD-10-CM | POA: Diagnosis not present

## 2023-12-26 ENCOUNTER — Ambulatory Visit: Admission: RE | Admit: 2023-12-26 | Payer: 59 | Source: Home / Self Care | Admitting: Gastroenterology

## 2023-12-26 ENCOUNTER — Telehealth: Payer: Self-pay | Admitting: Nurse Practitioner

## 2023-12-26 ENCOUNTER — Encounter: Admission: RE | Payer: Self-pay | Source: Home / Self Care

## 2023-12-26 SURGERY — COLONOSCOPY
Anesthesia: General

## 2023-12-26 NOTE — Telephone Encounter (Signed)
 Copied from CRM [id number]. Topic: General - Billing Inquiry >> Nov 27, 2023 11:17 AM Shelah Lewandowsky wrote: Reason for CRM: Patient received explanation of benefits from insurance and it states that this office is out of network, please call back to verify [phone] Patient spoke with billing and they would not help her. >> Dec 23, 2023  9:17 AM Fuller Mandril wrote: Patient called back states there is still $480 invoice outstanding from her last visit. She says its something that she is not able to resolve. It is between provider nad insurance. She is not sure why it has not been resolved but would like an update. Thank You

## 2024-02-03 ENCOUNTER — Ambulatory Visit
Admission: RE | Admit: 2024-02-03 | Discharge: 2024-02-03 | Disposition: A | Discharge status: Home / Self Care | Source: Ambulatory Visit | Attending: Nurse Practitioner | Admitting: Nurse Practitioner

## 2024-02-03 DIAGNOSIS — Z1231 Encounter for screening mammogram for malignant neoplasm of breast: Secondary | ICD-10-CM | POA: Diagnosis not present

## 2024-05-04 DIAGNOSIS — D2261 Melanocytic nevi of right upper limb, including shoulder: Secondary | ICD-10-CM | POA: Diagnosis not present

## 2024-05-04 DIAGNOSIS — D2272 Melanocytic nevi of left lower limb, including hip: Secondary | ICD-10-CM | POA: Diagnosis not present

## 2024-05-04 DIAGNOSIS — D2262 Melanocytic nevi of left upper limb, including shoulder: Secondary | ICD-10-CM | POA: Diagnosis not present

## 2024-05-04 DIAGNOSIS — D2271 Melanocytic nevi of right lower limb, including hip: Secondary | ICD-10-CM | POA: Diagnosis not present

## 2024-05-04 DIAGNOSIS — L400 Psoriasis vulgaris: Secondary | ICD-10-CM | POA: Diagnosis not present

## 2024-05-04 DIAGNOSIS — L821 Other seborrheic keratosis: Secondary | ICD-10-CM | POA: Diagnosis not present

## 2024-05-04 DIAGNOSIS — D225 Melanocytic nevi of trunk: Secondary | ICD-10-CM | POA: Diagnosis not present

## 2024-05-22 ENCOUNTER — Ambulatory Visit: Payer: 59 | Admitting: Nurse Practitioner

## 2024-05-22 ENCOUNTER — Encounter: Payer: Self-pay | Admitting: Nurse Practitioner

## 2024-05-22 VITALS — BP 106/63 | HR 77 | Temp 98.2°F | Ht 67.8 in | Wt 135.0 lb

## 2024-05-22 DIAGNOSIS — E7800 Pure hypercholesterolemia, unspecified: Secondary | ICD-10-CM

## 2024-05-22 DIAGNOSIS — I10 Essential (primary) hypertension: Secondary | ICD-10-CM | POA: Diagnosis not present

## 2024-05-22 DIAGNOSIS — F325 Major depressive disorder, single episode, in full remission: Secondary | ICD-10-CM

## 2024-05-22 DIAGNOSIS — F419 Anxiety disorder, unspecified: Secondary | ICD-10-CM

## 2024-05-22 MED ORDER — SERTRALINE HCL 25 MG PO TABS: 25.0000 mg | ORAL_TABLET | Freq: Every day | ORAL | 1 refills | Status: AC

## 2024-05-22 MED ORDER — HYDROCHLOROTHIAZIDE 12.5 MG PO TABS
12.5000 mg | ORAL_TABLET | Freq: Every day | ORAL | 1 refills | Status: AC
Start: 2024-05-22 — End: ?

## 2024-05-22 NOTE — Assessment & Plan Note (Signed)
 Chronic.  Controlled.  Continue with current medication regimen of HCTZ 12.5mg .  Recommend checking blood pressures at home and bringing log to next visit.  Refills sent today. Labs ordered today.  Return to clinic in 6 months for reevaluation.  Call sooner if concerns arise.

## 2024-05-22 NOTE — Assessment & Plan Note (Signed)
 Chronic.  Controlled.  Continue with diet and exercise.  Labs ordered today.  Return to clinic in 6 months for reevaluation.  Call sooner if concerns arise.   The 10-year ASCVD risk score (Arnett DK, et al., 2019) is: 4.1%   Values used to calculate the score:     Age: 63 years     Clincally relevant sex: Female     Is Non-Hispanic African American: No     Diabetic: No     Tobacco smoker: No     Systolic Blood Pressure: 106 mmHg     Is BP treated: Yes     HDL Cholesterol: 54 mg/dL     Total Cholesterol: 226 mg/dL

## 2024-05-22 NOTE — Assessment & Plan Note (Signed)
 Chronic.  Controlled.  Will decrease the dose of Zoloft  to 25mg  daily.   Can wean off medication at next visit if patient is doing well.  Refills sent today.  Labs ordered today.  Return to clinic in 6 months for reevaluation.  Call sooner if concerns arise.

## 2024-05-22 NOTE — Progress Notes (Signed)
 BP 106/63   Pulse 77   Temp 98.2 F (36.8 C) (Oral)   Ht 5' 7.8 (1.722 m)   Wt 135 lb (61.2 kg)   SpO2 98%   BMI 20.65 kg/m    Subjective:    Patient ID: Sierra Curtis, female    DOB: 01-23-1961, 63 y.o.   MRN: [id number]  HPI: Sierra Curtis is a 63 y.o. female  Chief Complaint  Patient presents with   Hyperlipidemia   Depression   HYPERTENSION / HYPERLIPIDEMIA Satisfied with current treatment? yes Duration of hypertension: years BP monitoring frequency: not checking BP range:  BP medication side effects: no Past BP meds: HCTZ Duration of hyperlipidemia: years Cholesterol medication side effects: no Cholesterol supplements: none Past cholesterol medications: none Medication compliance: excellent compliance Aspirin: no Recent stressors: no Recurrent headaches: no Visual changes: no Palpitations: no Dyspnea: no Chest pain: no Lower extremity edema: no Dizzy/lightheaded: no  ANXIETY/DEPRESSION Patient is taking Zoloft  50mg  daily.  Feels like this is working well for her.  She does wonder if she needs the medication at all anymore.  Would like to try and wean down on the medication. Denies concerns at visit today.     Relevant past medical, surgical, family and social history reviewed and updated as indicated. Interim medical history since our last visit reviewed. Allergies and medications reviewed and updated.  Review of Systems  Eyes:  Negative for visual disturbance.  Respiratory:  Negative for cough, chest tightness and shortness of breath.   Cardiovascular:  Negative for chest pain, palpitations and leg swelling.  Neurological:  Negative for dizziness and headaches.  Psychiatric/Behavioral:  Negative for dysphoric mood and suicidal ideas. The patient is not nervous/anxious.     Per HPI unless specifically indicated above     Objective:    BP 106/63   Pulse 77   Temp 98.2 F (36.8 C) (Oral)   Ht 5' 7.8 (1.722 m)   Wt 135 lb (61.2 kg)   SpO2  98%   BMI 20.65 kg/m   Wt Readings from Last 3 Encounters:  05/22/24 135 lb (61.2 kg)  11/22/23 133 lb (60.3 kg)  05/23/23 133 lb 9.6 oz (60.6 kg)    Physical Exam Vitals and nursing note reviewed.  Constitutional:      General: She is not in acute distress.    Appearance: Normal appearance. She is normal weight. She is not ill-appearing, toxic-appearing or diaphoretic.  HENT:     Head: Normocephalic.     Right Ear: External ear normal.     Left Ear: External ear normal.     Nose: Nose normal.     Mouth/Throat:     Mouth: Mucous membranes are moist.     Pharynx: Oropharynx is clear.  Eyes:     General:        Right eye: No discharge.        Left eye: No discharge.     Extraocular Movements: Extraocular movements intact.     Conjunctiva/sclera: Conjunctivae normal.     Pupils: Pupils are equal, round, and reactive to light.  Cardiovascular:     Rate and Rhythm: Normal rate and regular rhythm.     Heart sounds: No murmur heard. Pulmonary:     Effort: Pulmonary effort is normal. No respiratory distress.     Breath sounds: Normal breath sounds. No wheezing or rales.  Musculoskeletal:     Cervical back: Normal range of motion and neck supple.  Skin:  General: Skin is warm and dry.     Capillary Refill: Capillary refill takes less than 2 seconds.  Neurological:     General: No focal deficit present.     Mental Status: She is alert and oriented to person, place, and time. Mental status is at baseline.  Psychiatric:        Mood and Affect: Mood normal.        Behavior: Behavior normal.        Thought Content: Thought content normal.        Judgment: Judgment normal.     Results for orders placed or performed in visit on 11/22/23  CBC with Differential/Platelet   Collection Time: 11/22/23  9:30 AM  Result Value Ref Range   WBC 4.0 3.4 - 10.8 x10E3/uL   RBC 3.91 3.77 - 5.28 x10E6/uL   Hemoglobin 12.6 11.1 - 15.9 g/dL   Hematocrit 61.5 65.9 - 46.6 %   MCV 98 (H) 79 -  97 fL   MCH 32.2 26.6 - 33.0 pg   MCHC 32.8 31.5 - 35.7 g/dL   RDW 87.3 88.2 - 84.5 %   Platelets 207 150 - 450 x10E3/uL   Neutrophils 49 Not Estab. %   Lymphs 38 Not Estab. %   Monocytes 9 Not Estab. %   Eos 3 Not Estab. %   Basos 1 Not Estab. %   Neutrophils Absolute 2.0 1.4 - 7.0 x10E3/uL   Lymphocytes Absolute 1.5 0.7 - 3.1 x10E3/uL   Monocytes Absolute 0.4 0.1 - 0.9 x10E3/uL   EOS (ABSOLUTE) 0.1 0.0 - 0.4 x10E3/uL   Basophils Absolute 0.0 0.0 - 0.2 x10E3/uL   Immature Granulocytes 0 Not Estab. %   Immature Grans (Abs) 0.0 0.0 - 0.1 x10E3/uL  Comprehensive metabolic panel   Collection Time: 11/22/23  9:30 AM  Result Value Ref Range   Glucose 92 70 - 99 mg/dL   BUN 11 8 - 27 mg/dL   Creatinine, Ser 9.25 0.57 - 1.00 mg/dL   eGFR 92 >40 fO/fpw/8.26   BUN/Creatinine Ratio 15 12 - 28   Sodium 143 134 - 144 mmol/L   Potassium 4.1 3.5 - 5.2 mmol/L   Chloride 102 96 - 106 mmol/L   CO2 25 20 - 29 mmol/L   Calcium 10.0 8.7 - 10.3 mg/dL   Total Protein 6.6 6.0 - 8.5 g/dL   Albumin 4.6 3.9 - 4.9 g/dL   Globulin, Total 2.0 1.5 - 4.5 g/dL   Bilirubin Total 0.3 0.0 - 1.2 mg/dL   Alkaline Phosphatase 67 44 - 121 IU/L   AST 21 0 - 40 IU/L   ALT 25 0 - 32 IU/L  Lipid panel   Collection Time: 11/22/23  9:30 AM  Result Value Ref Range   Cholesterol, Total 226 (H) 100 - 199 mg/dL   Triglycerides 882 0 - 149 mg/dL   HDL 54 >60 mg/dL   VLDL Cholesterol Cal 21 5 - 40 mg/dL   LDL Chol Calc (NIH) 848 (H) 0 - 99 mg/dL   Chol/HDL Ratio 4.2 0.0 - 4.4 ratio  TSH   Collection Time: 11/22/23  9:30 AM  Result Value Ref Range   TSH 2.360 0.450 - 4.500 uIU/mL      Assessment & Plan:   Problem List Items Addressed This Visit       Cardiovascular and Mediastinum   Hypertension - Primary   Chronic.  Controlled.  Continue with current medication regimen of HCTZ 12.5mg .  Recommend checking blood pressures at  home and bringing log to next visit.  Refills sent today. Labs ordered today.  Return  to clinic in 6 months for reevaluation.  Call sooner if concerns arise.       Relevant Medications   hydrochlorothiazide  (HYDRODIURIL ) 12.5 MG tablet   Other Relevant Orders   Comprehensive metabolic panel with GFR     Other   Depression   Chronic.  Controlled.  Will decrease the dose of Zoloft  to 25mg  daily.   Can wean off medication at next visit if patient is doing well.  Refills sent today.  Labs ordered today.  Return to clinic in 6 months for reevaluation.  Call sooner if concerns arise.       Relevant Medications   sertraline  (ZOLOFT ) 25 MG tablet   Anxiety   Chronic.  Controlled.  Will decrease the dose of Zoloft  to 25mg  daily.   Can wean off medication at next visit if patient is doing well.  Refills sent today.  Labs ordered today.  Return to clinic in 6 months for reevaluation.  Call sooner if concerns arise.       Relevant Medications   sertraline  (ZOLOFT ) 25 MG tablet   Hypercholesterolemia   Chronic.  Controlled.  Continue with diet and exercise.  Labs ordered today.  Return to clinic in 6 months for reevaluation.  Call sooner if concerns arise.   The 10-year ASCVD risk score (Arnett DK, et al., 2019) is: 4.1%   Values used to calculate the score:     Age: 29 years     Clincally relevant sex: Female     Is Non-Hispanic African American: No     Diabetic: No     Tobacco smoker: No     Systolic Blood Pressure: 106 mmHg     Is BP treated: Yes     HDL Cholesterol: 54 mg/dL     Total Cholesterol: 226 mg/dL      Relevant Medications   hydrochlorothiazide  (HYDRODIURIL ) 12.5 MG tablet   Other Relevant Orders   Lipid panel     Follow up plan: No follow-ups on file.

## 2024-05-23 LAB — LIPID PANEL
Chol/HDL Ratio: 4.5 ratio — ABNORMAL HIGH (ref 0.0–4.4)
Cholesterol, Total: 246 mg/dL — ABNORMAL HIGH (ref 100–199)
HDL: 55 mg/dL (ref >39)
LDL Chol Calc (NIH): 163 mg/dL — ABNORMAL HIGH (ref 0–99)
Triglycerides: 156 mg/dL — ABNORMAL HIGH (ref 0–149)
VLDL Cholesterol Cal: 28 mg/dL (ref 5–40)

## 2024-05-23 LAB — COMPREHENSIVE METABOLIC PANEL WITH GFR
ALT: 18 IU/L (ref 0–32)
AST: 19 IU/L (ref 0–40)
Albumin: 4.6 g/dL (ref 3.9–4.9)
Alkaline Phosphatase: 69 IU/L (ref 44–121)
BUN/Creatinine Ratio: 14 (ref 12–28)
BUN: 11 mg/dL (ref 8–27)
Bilirubin Total: 0.5 mg/dL (ref 0.0–1.2)
CO2: 27 mmol/L (ref 20–29)
Calcium: 9.7 mg/dL (ref 8.7–10.3)
Chloride: 101 mmol/L (ref 96–106)
Creatinine, Ser: 0.78 mg/dL (ref 0.57–1.00)
Globulin, Total: 2.2 g/dL (ref 1.5–4.5)
Glucose: 93 mg/dL (ref 70–99)
Potassium: 4.2 mmol/L (ref 3.5–5.2)
Sodium: 141 mmol/L (ref 134–144)
Total Protein: 6.8 g/dL (ref 6.0–8.5)
eGFR: 86 mL/min/1.73 (ref >59)

## 2024-05-26 ENCOUNTER — Ambulatory Visit: Payer: Self-pay | Admitting: Nurse Practitioner

## 2024-06-16 DIAGNOSIS — Z23 Encounter for immunization: Secondary | ICD-10-CM | POA: Diagnosis not present

## 2024-11-24 ENCOUNTER — Ambulatory Visit: Admitting: Nurse Practitioner
